# Patient Record
Sex: Female | Born: 1985 | Race: White | Hispanic: No | Marital: Single | Smoking: Never smoker
Health system: Southern US, Community
[De-identification: ages and names within clinical notes are randomized; demographics above are authoritative.]

## PROBLEM LIST (undated history)

## (undated) DIAGNOSIS — F319 Bipolar disorder, unspecified: Secondary | ICD-10-CM

## (undated) DIAGNOSIS — S069X9A Unspecified intracranial injury with loss of consciousness of unspecified duration, initial encounter: Secondary | ICD-10-CM

## (undated) DIAGNOSIS — K219 Gastro-esophageal reflux disease without esophagitis: Secondary | ICD-10-CM

## (undated) DIAGNOSIS — F4310 Post-traumatic stress disorder, unspecified: Secondary | ICD-10-CM

## (undated) DIAGNOSIS — S069XAA Unspecified intracranial injury with loss of consciousness status unknown, initial encounter: Secondary | ICD-10-CM

## (undated) DIAGNOSIS — R748 Abnormal levels of other serum enzymes: Secondary | ICD-10-CM

## (undated) DIAGNOSIS — I10 Essential (primary) hypertension: Secondary | ICD-10-CM

## (undated) DIAGNOSIS — G629 Polyneuropathy, unspecified: Secondary | ICD-10-CM

## (undated) DIAGNOSIS — R000 Tachycardia, unspecified: Secondary | ICD-10-CM

## (undated) DIAGNOSIS — D509 Iron deficiency anemia, unspecified: Secondary | ICD-10-CM

## (undated) DIAGNOSIS — G5700 Lesion of sciatic nerve, unspecified lower limb: Secondary | ICD-10-CM

## (undated) DIAGNOSIS — G43909 Migraine, unspecified, not intractable, without status migrainosus: Secondary | ICD-10-CM

## (undated) DIAGNOSIS — G4730 Sleep apnea, unspecified: Secondary | ICD-10-CM

## (undated) DIAGNOSIS — F32A Depression, unspecified: Secondary | ICD-10-CM

## (undated) DIAGNOSIS — U071 COVID-19: Secondary | ICD-10-CM

## (undated) DIAGNOSIS — F419 Anxiety disorder, unspecified: Secondary | ICD-10-CM

## (undated) DIAGNOSIS — R569 Unspecified convulsions: Secondary | ICD-10-CM

## (undated) HISTORY — PX: APPENDECTOMY: SHX54

## (undated) HISTORY — PX: OTHER SURGICAL HISTORY: SHX169

## (undated) HISTORY — PX: ABDOMINAL HYSTERECTOMY: SHX81

## (undated) HISTORY — PX: CHOLECYSTECTOMY: SHX55

## (undated) HISTORY — PX: TONSILLECTOMY: SUR1361

## (undated) HISTORY — PX: NOSE SURGERY: SHX723

## (undated) HISTORY — PX: TUBAL LIGATION: SHX77

---
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
## 2007-07-03 ENCOUNTER — Other Ambulatory Visit: Payer: Self-pay

## 2007-07-04 ENCOUNTER — Inpatient Hospital Stay: Payer: Self-pay | Admitting: Internal Medicine

## 2007-12-07 ENCOUNTER — Emergency Department (HOSPITAL_COMMUNITY): Admission: EM | Admit: 2007-12-07 | Discharge: 2007-12-08 | Payer: Self-pay | Admitting: Emergency Medicine
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
## 2011-08-16 ENCOUNTER — Other Ambulatory Visit (HOSPITAL_BASED_OUTPATIENT_CLINIC_OR_DEPARTMENT_OTHER): Payer: Self-pay | Admitting: Internal Medicine

## 2011-08-16 DIAGNOSIS — E221 Hyperprolactinemia: Secondary | ICD-10-CM

## 2011-08-16 DIAGNOSIS — R51 Headache: Secondary | ICD-10-CM

## 2011-08-22 ENCOUNTER — Ambulatory Visit (HOSPITAL_BASED_OUTPATIENT_CLINIC_OR_DEPARTMENT_OTHER)
Admission: RE | Admit: 2011-08-22 | Discharge: 2011-08-22 | Disposition: A | Discharge status: Home / Self Care | Payer: Medicaid Other | Source: Ambulatory Visit | Attending: Internal Medicine | Admitting: Internal Medicine

## 2011-08-22 DIAGNOSIS — R51 Headache: Secondary | ICD-10-CM | POA: Insufficient documentation

## 2011-08-22 DIAGNOSIS — E221 Hyperprolactinemia: Secondary | ICD-10-CM

## 2011-08-22 DIAGNOSIS — R569 Unspecified convulsions: Secondary | ICD-10-CM | POA: Insufficient documentation

## 2011-08-22 DIAGNOSIS — E229 Hyperfunction of pituitary gland, unspecified: Secondary | ICD-10-CM | POA: Insufficient documentation

## 2011-08-22 MED ORDER — GADOBENATE DIMEGLUMINE 529 MG/ML IV SOLN
9.0000 mL | Freq: Once | INTRAVENOUS | Status: AC | PRN
  Administered 2011-08-22: 9 mL via INTRAVENOUS

## 2014-10-19 ENCOUNTER — Emergency Department: Payer: Medicaid Other

## 2014-10-19 ENCOUNTER — Emergency Department
Admission: EM | Admit: 2014-10-19 | Discharge: 2014-10-19 | Disposition: A | Discharge status: Home / Self Care | Payer: Medicaid Other | Attending: Emergency Medicine | Admitting: Emergency Medicine

## 2014-10-19 ENCOUNTER — Encounter: Payer: Self-pay | Admitting: Emergency Medicine

## 2014-10-19 DIAGNOSIS — Y998 Other external cause status: Secondary | ICD-10-CM | POA: Diagnosis not present

## 2014-10-19 DIAGNOSIS — X58XXXA Exposure to other specified factors, initial encounter: Secondary | ICD-10-CM | POA: Diagnosis not present

## 2014-10-19 DIAGNOSIS — Y9301 Activity, walking, marching and hiking: Secondary | ICD-10-CM | POA: Diagnosis not present

## 2014-10-19 DIAGNOSIS — Z79899 Other long term (current) drug therapy: Secondary | ICD-10-CM | POA: Diagnosis not present

## 2014-10-19 DIAGNOSIS — S93431A Sprain of tibiofibular ligament of right ankle, initial encounter: Secondary | ICD-10-CM | POA: Diagnosis not present

## 2014-10-19 DIAGNOSIS — S93401A Sprain of unspecified ligament of right ankle, initial encounter: Secondary | ICD-10-CM

## 2014-10-19 DIAGNOSIS — Y9289 Other specified places as the place of occurrence of the external cause: Secondary | ICD-10-CM | POA: Diagnosis not present

## 2014-10-19 DIAGNOSIS — S99911A Unspecified injury of right ankle, initial encounter: Secondary | ICD-10-CM | POA: Diagnosis present

## 2014-10-19 MED ORDER — NAPROXEN 500 MG PO TABS
500.0000 mg | ORAL_TABLET | Freq: Once | ORAL | Status: AC
  Administered 2014-10-19: 500 mg via ORAL
  Filled 2014-10-19: qty 1

## 2014-10-19 MED ORDER — TRAMADOL HCL 50 MG PO TABS
50.0000 mg | ORAL_TABLET | Freq: Once | ORAL | Status: AC
  Administered 2014-10-19: 50 mg via ORAL
  Filled 2014-10-19: qty 1

## 2014-10-19 MED ORDER — TRAMADOL HCL 50 MG PO TABS: 50.0000 mg | ORAL_TABLET | Freq: Four times a day (QID) | ORAL | Status: DC | PRN

## 2014-10-19 MED ORDER — NAPROXEN 500 MG PO TABS: 500.0000 mg | ORAL_TABLET | Freq: Two times a day (BID) | ORAL | Status: AC

## 2014-10-19 NOTE — Discharge Instructions (Signed)

## 2014-10-19 NOTE — ED Provider Notes (Signed)
[facility] Emergency Department Provider Note ____________________________________________  Time seen: Approximately 10:36 PM  I have reviewed the triage vital signs and the nursing notes.   HISTORY  Chief Complaint Ankle Pain   HPI Sheila Cooper is a 29 y.o. female who presents to the emergency department for evaluation of right ankle pain. She states that the pain started after walking on a wooded trail yesterday. No specific injury.   History reviewed. No pertinent past medical history.  There are no active problems to display for this patient.   Past Surgical History  Procedure Laterality Date  . Tubal ligation    . Nose surgery      Current Outpatient Rx  Name  Route  Sig  Dispense  Refill  . ARIPiprazole (ABILIFY) 2 MG tablet   Oral   Take 1 mg by mouth daily.         . clonazePAM (KLONOPIN) 0.5 MG tablet   Oral   Take 0.5 mg by mouth 2 (two) times daily as needed for anxiety.         Marland Kitchen QUEtiapine (SEROQUEL) 50 MG tablet   Oral   Take 50 mg by mouth at bedtime.         . topiramate (TOPAMAX) 100 MG tablet   Oral   Take 100 mg by mouth 2 (two) times daily.         . naproxen (NAPROSYN) 500 MG tablet   Oral   Take 1 tablet (500 mg total) by mouth 2 (two) times daily with a meal.   60 tablet   2   . traMADol (ULTRAM) 50 MG tablet   Oral   Take 1 tablet (50 mg total) by mouth every 6 (six) hours as needed.   9 tablet   0     Allergies Review of patient's allergies indicates no known allergies.  No family history on file.  Social History Social History  Substance Use Topics  . Smoking status: Never Smoker   . Smokeless tobacco: None  . Alcohol Use: No    Review of Systems Constitutional: No recent illness. Eyes: No visual changes. ENT: No sore throat. Cardiovascular: Denies chest pain or palpitations. Respiratory: Denies shortness of breath. Gastrointestinal: No abdominal pain.  Genitourinary: Negative  for dysuria. Musculoskeletal: Pain in right ankle. Skin: Negative for rash. Neurological: Negative for headaches, focal weakness or numbness. 10-point ROS otherwise negative.  ____________________________________________   PHYSICAL EXAM:  VITAL SIGNS: ED Triage Vitals  Enc Vitals Group     BP 10/19/14 2124 138/89 mmHg     Pulse Rate 10/19/14 2124 109     Resp 10/19/14 2124 18     Temp 10/19/14 2124 97.6 F (36.4 C)     Temp Source 10/19/14 2124 Oral     SpO2 10/19/14 2124 99 %     Weight 10/19/14 2124 182 lb (82.555 kg)     Height 10/19/14 2124  (1.651 m)     Head Cir --      Peak Flow --      Pain Score 10/19/14 2125 10     Pain Loc --      Pain Edu? --      Excl. in GC? --     Constitutional: Alert and oriented. Well appearing and in no acute distress. Eyes: Conjunctivae are normal. EOMI. Head: Atraumatic. Nose: No congestion/rhinnorhea. Neck: No stridor.  Respiratory: Normal respiratory effort.   Musculoskeletal: No edema or erythema noted. Limited ROM  due to pain. Neurologic:  Normal speech and language. No gross focal neurologic deficits are appreciated. Speech is normal. No gait instability. Skin:  Skin is warm, dry and intact. Atraumatic. Psychiatric: Mood and affect are normal. Speech and behavior are normal.  ____________________________________________   LABS (all labs ordered are listed, but only abnormal results are displayed)  Labs Reviewed - No data to display ____________________________________________  RADIOLOGY  Negative for acute abnormality.  I, Kem Boroughs, personally viewed and evaluated these images (plain radiographs) as part of my medical decision making.   ____________________________________________   PROCEDURES  Procedure(s) performed: SPLINT APPLICATION Date/Time: 10:39 PM Authorized by: Kem Boroughs Consent: Verbal consent obtained. Risks and benefits: risks, benefits and alternatives were discussed Consent given  by: patient Splint applied by: Marylu Lund, ER technician Location details: right ankle Splint type: stirrup Supplies used: velcro splint. Post-procedure: The splinted body part was neurovascularly unchanged following the procedure. Patient tolerance: Patient tolerated the procedure well with no immediate complications.      ____________________________________________   INITIAL IMPRESSION / ASSESSMENT AND PLAN / ED COURSE  Pertinent labs & imaging results that were available during my care of the patient were reviewed by me and considered in my medical decision making (see chart for details).  Patient was advised to follow up with orthopedics for symptoms that are not improving over the next 7 days. She was  also advised to return to the ER for symptoms that change or worsen if unable to schedule an appointment.     Chinita Pester, FNP 10/19/14 2241  Loleta Rose, MD 10/20/14 0001

## 2014-10-19 NOTE — ED Notes (Signed)
Pt to triage via w/c with no distress noted; pt reports right ankle after injuring while walking yesterday

## 2014-12-28 ENCOUNTER — Encounter: Payer: Self-pay | Admitting: Emergency Medicine

## 2014-12-28 ENCOUNTER — Emergency Department
Admission: EM | Admit: 2014-12-28 | Discharge: 2014-12-28 | Disposition: A | Discharge status: Home / Self Care | Payer: Medicaid Other | Attending: Emergency Medicine | Admitting: Emergency Medicine

## 2014-12-28 DIAGNOSIS — Z791 Long term (current) use of non-steroidal anti-inflammatories (NSAID): Secondary | ICD-10-CM | POA: Insufficient documentation

## 2014-12-28 DIAGNOSIS — Z79899 Other long term (current) drug therapy: Secondary | ICD-10-CM | POA: Insufficient documentation

## 2014-12-28 DIAGNOSIS — R05 Cough: Secondary | ICD-10-CM | POA: Diagnosis present

## 2014-12-28 DIAGNOSIS — J029 Acute pharyngitis, unspecified: Secondary | ICD-10-CM | POA: Insufficient documentation

## 2014-12-28 HISTORY — DX: Unspecified convulsions: R56.9

## 2014-12-28 HISTORY — DX: Anxiety disorder, unspecified: F41.9

## 2014-12-28 HISTORY — DX: Bipolar disorder, unspecified: F31.9

## 2014-12-28 LAB — POCT RAPID STREP A: STREPTOCOCCUS, GROUP A SCREEN (DIRECT): NEGATIVE

## 2014-12-28 MED ORDER — MAGIC MOUTHWASH W/LIDOCAINE: 5.0000 mL | Freq: Four times a day (QID) | ORAL | Status: DC

## 2014-12-28 MED ORDER — BENZONATATE 200 MG PO CAPS: 200.0000 mg | ORAL_CAPSULE | Freq: Three times a day (TID) | ORAL | Status: DC | PRN

## 2014-12-28 NOTE — ED Notes (Signed)
Pt to ed with c/o sore throat, cough since Saturday.

## 2014-12-28 NOTE — ED Provider Notes (Signed)
[facility] Emergency Department Provider Note  ____________________________________________  Time seen: Approximately 1:24 PM  I have reviewed the triage vital signs and the nursing notes.   HISTORY  Chief Complaint Cough and Sore Throat    HPI Sheila Cooper is a 29 y.o. female presents to the emergency department complaining of a sore throat for 2 days. She states initially she had some general malaise on Saturday and progressed into a "slightly sore throat." Over the intervening. The sore throat has greatly increased. She states she has mild nasal congestion, a cough, possible low-grade tactile fever. She denies taking any medications over-the-counter prior to arrival. No difficulty swallowing or breathing. Symptoms are constant, worsening, worse with talking and swallowing.   Past Medical History  Diagnosis Date  . Seizures (HCC)   . Bipolar 1 disorder (HCC)   . Anxiety     There are no active problems to display for this patient.   Past Surgical History  Procedure Laterality Date  . Tubal ligation    . Nose surgery      Current Outpatient Rx  Name  Route  Sig  Dispense  Refill  . ARIPiprazole (ABILIFY) 2 MG tablet   Oral   Take 1 mg by mouth daily.         . benzonatate (TESSALON) 200 MG capsule   Oral   Take 1 capsule (200 mg total) by mouth 3 (three) times daily as needed for cough.   21 capsule   0   . clonazePAM (KLONOPIN) 0.5 MG tablet   Oral   Take 0.5 mg by mouth 2 (two) times daily as needed for anxiety.         . magic mouthwash w/lidocaine SOLN   Oral   Take 5 mLs by mouth 4 (four) times daily.   240 mL   0     Dispense in a 1/1/1/1 ratio   . naproxen (NAPROSYN) 500 MG tablet   Oral   Take 1 tablet (500 mg total) by mouth 2 (two) times daily with a meal.   60 tablet   2   . QUEtiapine (SEROQUEL) 50 MG tablet   Oral   Take 50 mg by mouth at bedtime.         . topiramate (TOPAMAX) 100 MG tablet   Oral  Take 100 mg by mouth 2 (two) times daily.         . traMADol (ULTRAM) 50 MG tablet   Oral   Take 1 tablet (50 mg total) by mouth every 6 (six) hours as needed.   9 tablet   0     Allergies Review of patient's allergies indicates no known allergies.  History reviewed. No pertinent family history.  Social History Social History  Substance Use Topics  . Smoking status: Never Smoker   . Smokeless tobacco: None  . Alcohol Use: No    Review of Systems Constitutional: No fever/chills Eyes: No visual changes. ENT: Endorses sore throat. Endorses mild nasal congestion. Cardiovascular: Denies chest pain. Respiratory: Denies shortness of breath. Dorsum his cough. Gastrointestinal: No abdominal pain.  No nausea, no vomiting.  No diarrhea.  No constipation. Genitourinary: Negative for dysuria. Musculoskeletal: Negative for back pain. Skin: Negative for rash. Neurological: Negative for headaches, focal weakness or numbness.  10-point ROS otherwise negative.  ____________________________________________   PHYSICAL EXAM:  VITAL SIGNS: ED Triage Vitals  Enc Vitals Group     BP 12/28/14 1314 162/102 mmHg     Pulse  Rate 12/28/14 1314 109     Resp 12/28/14 1314 22     Temp 12/28/14 1314 98.1 F (36.7 C)     Temp Source 12/28/14 1314 Oral     SpO2 12/28/14 1314 99 %     Weight 12/28/14 1314 162 lb (73.483 kg)     Height 12/28/14 1314 5\' 5"  (1.651 m)     Head Cir --      Peak Flow --      Pain Score 12/28/14 1314 9     Pain Loc --      Pain Edu? --      Excl. in GC? --     Constitutional: Alert and oriented. Well appearing and in no acute distress. Eyes: Conjunctivae are normal. PERRL. EOMI. Head: Atraumatic. Nose: Minimal clear congestion/rhinnorhea. Mouth/Throat: Mucous membranes are moist.  Oropharynx erythematous. Tonsils are erythematous and mildly edematous, but no exudates are visualized. Neck: No stridor.   Hematological/Lymphatic/Immunilogical: Diffuse,  nontender, mobile anterior cervical lymphadenopathy. Cardiovascular: Normal rate, regular rhythm. Grossly normal heart sounds.  Good peripheral circulation. Respiratory: Normal respiratory effort.  No retractions. Lungs CTAB. Gastrointestinal: Soft and nontender. No distention. No abdominal bruits. No CVA tenderness. Musculoskeletal: No lower extremity tenderness nor edema.  No joint effusions. Neurologic:  Normal speech and language. No gross focal neurologic deficits are appreciated. No gait instability. Skin:  Skin is warm, dry and intact. No rash noted. Psychiatric: Mood and affect are normal. Speech and behavior are normal.  ____________________________________________   LABS (all labs ordered are listed, but only abnormal results are displayed)  Labs Reviewed  CULTURE, GROUP A STREP ([facility] ONLY)  POCT RAPID STREP A   ____________________________________________  EKG   ____________________________________________  RADIOLOGY   ____________________________________________   PROCEDURES  Procedure(s) performed: None  Critical Care performed: No  ____________________________________________   INITIAL IMPRESSION / ASSESSMENT AND PLAN / ED COURSE  Pertinent labs & imaging results that were available during my care of the patient were reviewed by me and considered in my medical decision making (see chart for details).  Patient's history, symptoms, physical exam, rapid strep test are taken into consideration for diagnosis. Patient's symptoms are most consistent with a viral pharyngitis. I advised patient of findings and diagnosis she verbalizes understanding. The patient will be given medications for symptomatic relief. Patient is to return to emergency department for any worsening of symptoms to include difficulty swallowing or difficulty breathing. ____________________________________________   FINAL CLINICAL IMPRESSION(S) / ED DIAGNOSES  Final diagnoses:  Pharyngitis       Racheal PatchesJonathan D Starr Engel, PA-C 12/28/14 1441  Governor Rooksebecca Lord, MD 12/28/14 [phone]

## 2014-12-28 NOTE — ED Notes (Signed)
Cough and sore throat for couple of days

## 2014-12-28 NOTE — Discharge Instructions (Signed)

## 2014-12-30 LAB — CULTURE, GROUP A STREP (THRC)

## 2015-01-05 ENCOUNTER — Encounter: Payer: Self-pay | Admitting: Urgent Care

## 2015-01-05 DIAGNOSIS — R51 Headache: Secondary | ICD-10-CM | POA: Diagnosis present

## 2015-01-05 DIAGNOSIS — G43901 Migraine, unspecified, not intractable, with status migrainosus: Secondary | ICD-10-CM | POA: Diagnosis not present

## 2015-01-05 NOTE — ED Notes (Signed)
Patient presents with c/o of a migraine headache that started at 1900. Denies N/V.

## 2015-01-06 ENCOUNTER — Emergency Department
Admission: EM | Admit: 2015-01-06 | Discharge: 2015-01-06 | Disposition: A | Discharge status: Home / Self Care | Payer: Medicaid Other | Attending: Emergency Medicine | Admitting: Emergency Medicine

## 2015-01-06 DIAGNOSIS — G43109 Migraine with aura, not intractable, without status migrainosus: Secondary | ICD-10-CM

## 2015-01-06 HISTORY — DX: Migraine, unspecified, not intractable, without status migrainosus: G43.909

## 2015-01-06 MED ORDER — METOCLOPRAMIDE HCL 10 MG PO TABS: 10.0000 mg | ORAL_TABLET | Freq: Three times a day (TID) | ORAL | Status: DC | PRN

## 2015-01-06 MED ORDER — SODIUM CHLORIDE 0.9 % IV BOLUS (SEPSIS)
1000.0000 mL | Freq: Once | INTRAVENOUS | Status: AC
  Administered 2015-01-06: 1000 mL via INTRAVENOUS

## 2015-01-06 MED ORDER — METOCLOPRAMIDE HCL 5 MG/ML IJ SOLN
10.0000 mg | Freq: Once | INTRAMUSCULAR | Status: AC
  Administered 2015-01-06: 10 mg via INTRAVENOUS
  Filled 2015-01-06: qty 2

## 2015-01-06 MED ORDER — KETOROLAC TROMETHAMINE 30 MG/ML IJ SOLN
30.0000 mg | Freq: Once | INTRAMUSCULAR | Status: AC
  Administered 2015-01-06: 30 mg via INTRAVENOUS
  Filled 2015-01-06: qty 1

## 2015-01-06 NOTE — Discharge Instructions (Signed)

## 2015-01-06 NOTE — ED Provider Notes (Signed)
[facility] Emergency Department Provider Note REMINDER - THIS NOTE IS NOT A FINAL MEDICAL RECORD UNTIL IT IS SIGNED. UNTIL THEN, THE CONTENT BELOW MAY REFLECT INFORMATION FROM A DOCUMENTATION TEMPLATE, NOT THE ACTUAL PATIENT VISIT. ____________________________________________  Time seen: Approximately 1:53 AM  I have reviewed the triage vital signs and the nursing notes.   HISTORY  Chief Complaint Migraine    HPI Sheila Cooper is a 29 y.o. female reports a previous history of migraines,bipolar disorder.  Patient tells me that she has frequent migraines almost as frequent as weekly, or sometimes even every few days.  She reports to me that she had a "migraine" started about 7 p today. She notes slight flickering lights in her eyes, and then the headache proceeded thereafter. She describes a primarily frontal and right sided throbbing headache. No vision changes at this time. No chest pain or trouble breathing. Denies fevers chills sore throat. No numbness or weakness. She describes a severe 10 out of 10, throbbing right-sided headache.  She has had a headache like this many times in the past, this is not the worst headache she has experienced, slowly worsened over the last couple hours and is associated with nausea.  She denies pregnancy, she has had a previous tubal ligation. Last menstrual period ended yesterday.   Past Medical History  Diagnosis Date  . Seizures (HCC)   . Bipolar 1 disorder (HCC)   . Anxiety   . Migraines     There are no active problems to display for this patient.   Past Surgical History  Procedure Laterality Date  . Tubal ligation    . Nose surgery      Current Outpatient Rx  Name  Route  Sig  Dispense  Refill  . ARIPiprazole (ABILIFY) 2 MG tablet   Oral   Take 1 mg by mouth daily.         . benzonatate (TESSALON) 200 MG capsule   Oral   Take 1 capsule (200 mg total) by mouth 3 (three) times daily as needed for  cough.   21 capsule   0   . clonazePAM (KLONOPIN) 0.5 MG tablet   Oral   Take 0.5 mg by mouth 2 (two) times daily as needed for anxiety.         . magic mouthwash w/lidocaine SOLN   Oral   Take 5 mLs by mouth 4 (four) times daily.   240 mL   0     Dispense in a 1/1/1/1 ratio   . metoCLOPramide (REGLAN) 10 MG tablet   Oral   Take 1 tablet (10 mg total) by mouth every 8 (eight) hours as needed for nausea or vomiting.   30 tablet   0   . naproxen (NAPROSYN) 500 MG tablet   Oral   Take 1 tablet (500 mg total) by mouth 2 (two) times daily with a meal.   60 tablet   2   . QUEtiapine (SEROQUEL) 50 MG tablet   Oral   Take 50 mg by mouth at bedtime.         . topiramate (TOPAMAX) 100 MG tablet   Oral   Take 100 mg by mouth 2 (two) times daily.         . traMADol (ULTRAM) 50 MG tablet   Oral   Take 1 tablet (50 mg total) by mouth every 6 (six) hours as needed.   9 tablet   0     Allergies Review  of patient's allergies indicates no known allergies.  No family history on file.  Social History Social History  Substance Use Topics  . Smoking status: Never Smoker   . Smokeless tobacco: None  . Alcohol Use: No    Review of Systems Constitutional: No fever/chills Eyes: No visual changes. ENT: No sore throat. Cardiovascular: Denies chest pain. Respiratory: Denies shortness of breath. Gastrointestinal: No abdominal pain.  No nausea, no vomiting.  No diarrhea.  No constipation. Genitourinary: Negative for dysuria. Musculoskeletal: Negative for back pain. Skin: Negative for rash. Neurological: Negative for focal weakness or numbness.  10-point ROS otherwise negative.  ____________________________________________   PHYSICAL EXAM:  VITAL SIGNS: ED Triage Vitals  Enc Vitals Group     BP 01/05/15 2352 145/95 mmHg     Pulse Rate 01/05/15 2352 85     Resp 01/05/15 2352 18     Temp 01/05/15 2352 98.4 F (36.9 C)     Temp Source 01/05/15 2352 Oral      SpO2 01/05/15 2352 98 %     Weight 01/05/15 2352 160 lb (72.576 kg)     Height 01/05/15 2352 5\' 5"  (1.651 m)     Head Cir --      Peak Flow --      Pain Score 01/05/15 2352 10     Pain Loc --      Pain Edu? --      Excl. in GC? --    Constitutional: Alert and oriented. Well appearing and in no acute distress. Eyes: Conjunctivae are normal. PERRL. EOMI. Head: Atraumatic. No temporal artery tenderness. Normal temporal artery pulsations bilaterally. Nose: No congestion/rhinnorhea. Mouth/Throat: Mucous membranes are moist.  Oropharynx non-erythematous. Neck: No stridor.  No meningismus. Cardiovascular: Normal rate, regular rhythm. Grossly normal heart sounds.  Good peripheral circulation. Respiratory: Normal respiratory effort.  No retractions. Lungs CTAB. Gastrointestinal: Soft and nontender. No distention. No abdominal bruits. No CVA tenderness. Musculoskeletal: No lower extremity tenderness nor edema.  No joint effusions. Neurologic:  Normal speech and language. No gross focal neurologic deficits are appreciated.   The patient has no pronator drift. The patient has normal cranial nerve exam. Extraocular movements are normal. Visual fields are normal. Patient has 5 out of 5 strength in all extremities. There is no numbness or gross, acute sensory abnormality in the extremities bilaterally. No speech disturbance. No dysarthria. No aphasia. No ataxia. Normal finger nose finger bilat. Patient speaking in full and clear sentences.   Skin:  Skin is warm, dry and intact. No rash noted. Psychiatric: Mood and affect are normal. Speech and behavior are normal.  ____________________________________________   LABS (all labs ordered are listed, but only abnormal results are displayed)  Labs Reviewed - No data to display ____________________________________________  EKG   ____________________________________________  RADIOLOGY  Patient reports [facility] ____________________________________________   PROCEDURES  Procedure(s) performed: None  Critical Care performed: No  ____________________________________________   INITIAL IMPRESSION / ASSESSMENT AND PLAN / ED COURSE  Pertinent labs & imaging results that were available during my care of the patient were reviewed by me and considered in my medical decision making (see chart for details).  Patient presents for headache. She has a history of previous similar headaches, and reports a well-documented history of "migraines". She does appear to describe scintilating cotomata preceding this headache, and she does not have any neurologic deficits or fever. No signs or symptoms of intracranial hemorrhage, meningitis, or other concerning acute causes for  headache at this time.  I will treat her with Reglan and Toradol. We will plan to reevaluate her after, given her long history of similar headaches diagnosed as migraines I suspect that this is the same, no evidence to support other concerning presentations.  ----------------------------------------- 1:56 AM on 01/06/2015 -----------------------------------------  Discuss careful return precautions and follow-up care with the patient who is agreeable.  Patient reports her pain is gone, she feels well and improved. Boyfriend will be driving her home. Careful return precautions discussed. Patient is awake and alert and in no distress. ____________________________________________   FINAL CLINICAL IMPRESSION(S) / ED DIAGNOSES  Final diagnoses:  Migraine with aura and without status migrainosus, not intractable      Sharyn Creamer, MD 01/06/15 [phone]

## 2016-07-18 ENCOUNTER — Other Ambulatory Visit: Payer: Self-pay | Admitting: Neurology

## 2016-07-18 ENCOUNTER — Emergency Department (HOSPITAL_COMMUNITY): Payer: Medicaid Other

## 2016-07-18 ENCOUNTER — Emergency Department (HOSPITAL_COMMUNITY)
Admission: EM | Admit: 2016-07-18 | Discharge: 2016-07-19 | Disposition: A | Discharge status: Home / Self Care | Payer: Medicaid Other | Attending: Emergency Medicine | Admitting: Emergency Medicine

## 2016-07-18 DIAGNOSIS — R4182 Altered mental status, unspecified: Secondary | ICD-10-CM | POA: Diagnosis present

## 2016-07-18 DIAGNOSIS — Y939 Activity, unspecified: Secondary | ICD-10-CM | POA: Diagnosis not present

## 2016-07-18 DIAGNOSIS — F445 Conversion disorder with seizures or convulsions: Secondary | ICD-10-CM | POA: Insufficient documentation

## 2016-07-18 DIAGNOSIS — Z79899 Other long term (current) drug therapy: Secondary | ICD-10-CM | POA: Diagnosis not present

## 2016-07-18 DIAGNOSIS — R569 Unspecified convulsions: Secondary | ICD-10-CM

## 2016-07-18 DIAGNOSIS — R791 Abnormal coagulation profile: Secondary | ICD-10-CM | POA: Insufficient documentation

## 2016-07-18 DIAGNOSIS — Y929 Unspecified place or not applicable: Secondary | ICD-10-CM | POA: Insufficient documentation

## 2016-07-18 DIAGNOSIS — W19XXXA Unspecified fall, initial encounter: Secondary | ICD-10-CM | POA: Diagnosis not present

## 2016-07-18 DIAGNOSIS — Y999 Unspecified external cause status: Secondary | ICD-10-CM | POA: Insufficient documentation

## 2016-07-18 DIAGNOSIS — R4189 Other symptoms and signs involving cognitive functions and awareness: Secondary | ICD-10-CM

## 2016-07-18 LAB — URINALYSIS, ROUTINE W REFLEX MICROSCOPIC
Bilirubin Urine: NEGATIVE
Glucose, UA: NEGATIVE mg/dL
Ketones, ur: NEGATIVE mg/dL
Nitrite: NEGATIVE
Protein, ur: NEGATIVE mg/dL
Specific Gravity, Urine: 1.012 (ref 1.005–1.030)
pH: 7 (ref 5.0–8.0)

## 2016-07-18 LAB — COMPREHENSIVE METABOLIC PANEL
ALT: 19 U/L (ref 14–54)
AST: 22 U/L (ref 15–41)
Albumin: 4.1 g/dL (ref 3.5–5.0)
Alkaline Phosphatase: 94 U/L (ref 38–126)
Anion gap: 9 (ref 5–15)
BUN: 10 mg/dL (ref 6–20)
CO2: 23 mmol/L (ref 22–32)
Calcium: 8.8 mg/dL — ABNORMAL LOW (ref 8.9–10.3)
Chloride: 106 mmol/L (ref 101–111)
Creatinine, Ser: 0.91 mg/dL (ref 0.44–1.00)
GFR calc Af Amer: >60 mL/min (ref >60)
GFR calc non Af Amer: >60 mL/min (ref >60)
Glucose, Bld: 92 mg/dL (ref 65–99)
Potassium: 3.6 mmol/L (ref 3.5–5.1)
Sodium: 138 mmol/L (ref 135–145)
Total Bilirubin: 0.5 mg/dL (ref 0.3–1.2)
Total Protein: 7.6 g/dL (ref 6.5–8.1)

## 2016-07-18 LAB — CBC
HCT: 42.2 % (ref 36.0–46.0)
Hemoglobin: 13.8 g/dL (ref 12.0–15.0)
MCH: 27.1 pg (ref 26.0–34.0)
MCHC: 32.7 g/dL (ref 30.0–36.0)
MCV: 82.9 fL (ref 78.0–100.0)
Platelets: 242 10*3/uL (ref 150–400)
RBC: 5.09 MIL/uL (ref 3.87–5.11)
RDW: 13.1 % (ref 11.5–15.5)
WBC: 12.1 10*3/uL — ABNORMAL HIGH (ref 4.0–10.5)

## 2016-07-18 LAB — PREPARE FRESH FROZEN PLASMA
Unit division: 0
Unit division: 0

## 2016-07-18 LAB — I-STAT CHEM 8, ED
BUN: 12 mg/dL (ref 6–20)
Calcium, Ion: 1.03 mmol/L — ABNORMAL LOW (ref 1.15–1.40)
Chloride: 107 mmol/L (ref 101–111)
Creatinine, Ser: 0.9 mg/dL (ref 0.44–1.00)
Glucose, Bld: 90 mg/dL (ref 65–99)
HCT: 41 % (ref 36.0–46.0)
Hemoglobin: 13.9 g/dL (ref 12.0–15.0)
Potassium: 3.6 mmol/L (ref 3.5–5.1)
Sodium: 141 mmol/L (ref 135–145)
TCO2: 28 mmol/L (ref 0–100)

## 2016-07-18 LAB — LACTIC ACID, PLASMA: Lactic Acid, Venous: 1.7 mmol/L (ref 0.5–1.9)

## 2016-07-18 LAB — BPAM FFP
Blood Product Expiration Date: 201806092359
Blood Product Expiration Date: 201806092359
ISSUE DATE / TIME: 201806051923
ISSUE DATE / TIME: 201806051923
Unit Type and Rh: 6200
Unit Type and Rh: 6200

## 2016-07-18 LAB — I-STAT CG4 LACTIC ACID, ED: Lactic Acid, Venous: 1.25 mmol/L (ref 0.5–1.9)

## 2016-07-18 LAB — PROTIME-INR
INR: 1.03
Prothrombin Time: 13.6 seconds (ref 11.4–15.2)

## 2016-07-18 LAB — ETHANOL: Alcohol, Ethyl (B): <5 mg/dL (ref <5)

## 2016-07-18 LAB — ABO/RH: ABO/RH(D): O NEG

## 2016-07-18 MED ORDER — SODIUM CHLORIDE 0.9 % IV BOLUS (SEPSIS)
1000.0000 mL | Freq: Once | INTRAVENOUS | Status: AC
  Administered 2016-07-18: 1000 mL via INTRAVENOUS

## 2016-07-18 MED ORDER — KETOROLAC TROMETHAMINE 15 MG/ML IJ SOLN
15.0000 mg | Freq: Once | INTRAMUSCULAR | Status: AC
  Administered 2016-07-18: 15 mg via INTRAVENOUS
  Filled 2016-07-18: qty 1

## 2016-07-18 NOTE — Progress Notes (Signed)
   07/18/16 1900  Clinical Encounter Type  Visited With Patient;Family;Health care provider  Visit Type Trauma  Referral From Care management   Chaplain responded first to a level one but it was downgraded to a level 2 within 20mins. [age] yr. female with an unwitnessed fall. After MD evaluated Pt, Chaplain asked Pt if she wanted staff to reach out to a family member, notifying them that she was at [facility]. Chaplain did not get a response, however a loved one did show up and chaplain (with approval from the Pt's RN) brought boyfriend/husband back to the trauma room to see the Pt.  Chaplain also provided hospitality by providing the loved one with something to drink as both Pt and family member waited for an update.

## 2016-07-18 NOTE — ED Provider Notes (Signed)
[facility]-EMERGENCY DEPT Provider Note   CSN: [id number] Arrival date & time: 07/18/16  1934   By signing my name below, I, Freida Busman, attest that this documentation has been prepared under the direction and in the presence of Raeford Razor, MD . Electronically Signed: Freida Busman, Scribe. 07/18/2016. 8:00 PM.   History   Chief Complaint Chief Complaint  Patient presents with  . Fall  . Altered Mental Status   LEVEL 5 CAVEAT DUE TO ACUITY OF MEDICAL CONDTION  The history is provided by the EMS personnel. No language interpreter was used.     HPI Comments:  Sheila Cooper is a 31 y.o. female who presents to the Emergency Department via EMS for AMS s/p fall today. Pt fell from an unknown height at an unknown time today. She noted to friend on scene that she struck her head on the concrete. EMS states pt did not remember the fall or anyting that has happend today. She complained of head and neck pain to EMS.  EMS notes decresed consciousuness en route but states pt was verbal upon their arrival. Pt reponds to painful stimuli per EMS. She has a h/o seizures and psych history. She was placed on new psych meds 1 week ago. EMS placed a 22 gauge IV in the left forearm. EMS notes 150/110 BP upon arrival to pt; last BP was 120/70. She has been tachycardic en route with a  CBG of 121 and O2 saturation of 96% on RA.  No past medical history on file.  There are no active problems to display for this patient.   No past surgical history on file.  OB History    No data available       Home Medications    Prior to Admission medications   Not on File    Family History No family history on file.  Social History Social History  Substance Use Topics  . Smoking status: Not on file  . Smokeless tobacco: Not on file  . Alcohol use Not on file     Allergies   Patient has no allergy information on record.   Review of Systems Review of Systems  Unable to perform ROS: Acuity of  condition     Physical Exam Updated Vital Signs BP (!) 143/93   Pulse (!) 113   Temp 97.8 F (36.6 C) (Temporal)   Resp 14   Ht 5\' 4"  (1.626 m)   Wt 220 lb (99.8 kg)   SpO2 99%   BMI 37.76 kg/m   Physical Exam  Constitutional: She appears well-developed and well-nourished.  HENT:  Head: Normocephalic and atraumatic.  No signs of trauma to the head or neck   Eyes: Conjunctivae are normal.  pupils equal ~32mm bilaterally  Neck:  Pt in c-collar  Cardiovascular: Normal heart sounds.  Tachycardia present.   Pulmonary/Chest: Effort normal and breath sounds normal. No respiratory distress.  Abdominal: She exhibits no distension.  Musculoskeletal:  Back is nml to inspection; no obvious step offs Not opening eyes to commands; resists manual attempts to open them  Occasional grunting   Withdraws all extremities to pain One arm raised above head pt will lower it herself Down going toes bilaterally   Skin: Skin is warm and dry.  No external signs of trauma  Nursing note and vitals reviewed.    ED Treatments / Results  DIAGNOSTIC STUDIES:  Oxygen Saturation is 98% on RA, normal by my interpretation.     Labs (all labs ordered  are listed, but only abnormal results are displayed) Labs Reviewed  COMPREHENSIVE METABOLIC PANEL - Abnormal; Notable for the following:       Result Value   Calcium 8.8 (*)    All other components within normal limits  CBC - Abnormal; Notable for the following:    WBC 12.1 (*)    All other components within normal limits  URINALYSIS, ROUTINE W REFLEX MICROSCOPIC - Abnormal; Notable for the following:    APPearance HAZY (*)    Hgb urine dipstick SMALL (*)    Leukocytes, UA MODERATE (*)    Bacteria, UA RARE (*)    Squamous Epithelial / LPF 6-30 (*)    All other components within normal limits  I-STAT CHEM 8, ED - Abnormal; Notable for the following:    Calcium, Ion 1.03 (*)    All other components within normal limits  ETHANOL  PROTIME-INR    LAMOTRIGINE LEVEL  LACTIC ACID, PLASMA  I-STAT CG4 LACTIC ACID, ED  TYPE AND SCREEN  PREPARE FRESH FROZEN PLASMA  ABO/RH    EKG  EKG Interpretation None       Radiology Ct Head Wo Contrast  Result Date: 07/18/2016 CLINICAL DATA:  Patient found unresponsive. EXAM: CT HEAD WITHOUT CONTRAST CT CERVICAL SPINE WITHOUT CONTRAST TECHNIQUE: Multidetector CT imaging of the head and cervical spine was performed following the standard protocol without intravenous contrast. Multiplanar CT image reconstructions of the cervical spine were also generated. COMPARISON:  None. FINDINGS: CT HEAD FINDINGS BRAIN: The ventricles and sulci are normal. No intraparenchymal hemorrhage, mass effect nor midline shift. No acute large vascular territory infarcts. No abnormal extra-axial fluid collections. Basal cisterns are midline and not effaced. No acute cerebellar abnormality. VASCULAR: No hyperdense vessels or unexpected calcifications. SKULL/SOFT TISSUES: No skull fracture. No significant soft tissue swelling. ORBITS/SINUSES: The included ocular globes and orbital contents are normal.The mastoid air-cells and included paranasal sinuses are well-aerated. OTHER: None. CT CERVICAL SPINE FINDINGS ALIGNMENT: Vertebral bodies in alignment. Maintained lordosis. SKULL BASE AND VERTEBRAE: Cervical vertebral bodies and posterior elements are intact. Intervertebral disc heights preserved. No destructive bony lesions. C1-2 articulation maintained. SOFT TISSUES AND SPINAL CANAL: Normal. DISC LEVELS: No significant osseous canal stenosis or neural foraminal narrowing. UPPER CHEST: Lung apices are clear. OTHER: None. IMPRESSION: 1. No acute intracranial abnormality. 2. No acute cervical spine fracture or posttraumatic subluxation. Electronically Signed   By: Tollie Ethavid  Kwon M.D.   On: 07/18/2016 19:59   Ct Cervical Spine Wo Contrast  Result Date: 07/18/2016 CLINICAL DATA:  Patient found unresponsive. EXAM: CT HEAD WITHOUT CONTRAST CT  CERVICAL SPINE WITHOUT CONTRAST TECHNIQUE: Multidetector CT imaging of the head and cervical spine was performed following the standard protocol without intravenous contrast. Multiplanar CT image reconstructions of the cervical spine were also generated. COMPARISON:  None. FINDINGS: CT HEAD FINDINGS BRAIN: The ventricles and sulci are normal. No intraparenchymal hemorrhage, mass effect nor midline shift. No acute large vascular territory infarcts. No abnormal extra-axial fluid collections. Basal cisterns are midline and not effaced. No acute cerebellar abnormality. VASCULAR: No hyperdense vessels or unexpected calcifications. SKULL/SOFT TISSUES: No skull fracture. No significant soft tissue swelling. ORBITS/SINUSES: The included ocular globes and orbital contents are normal.The mastoid air-cells and included paranasal sinuses are well-aerated. OTHER: None. CT CERVICAL SPINE FINDINGS ALIGNMENT: Vertebral bodies in alignment. Maintained lordosis. SKULL BASE AND VERTEBRAE: Cervical vertebral bodies and posterior elements are intact. Intervertebral disc heights preserved. No destructive bony lesions. C1-2 articulation maintained. SOFT TISSUES AND SPINAL CANAL: Normal. DISC LEVELS: No  significant osseous canal stenosis or neural foraminal narrowing. UPPER CHEST: Lung apices are clear. OTHER: None. IMPRESSION: 1. No acute intracranial abnormality. 2. No acute cervical spine fracture or posttraumatic subluxation. Electronically Signed   By: Tollie Eth M.D.   On: 07/18/2016 19:59    Procedures Procedures (including critical care time)  CRITICAL CARE Performed by: Raeford Razor, MD   Total critical care time: 35 minutes Critical care time was exclusive of separately billable procedures and treating other patients. Critical care was necessary to treat or prevent imminent or life-threatening deterioration. Critical care was time spent personally by me on the following activities: development of treatment plan  with patient and/or surrogate as well as nursing, discussions with consultants, evaluation of patient's response to treatment, examination of patient, obtaining history from patient or surrogate, ordering and performing treatments and interventions, ordering and review of laboratory studies, ordering and review of radiographic studies, pulse oximetry and re-evaluation of patient's condition.  Medications Ordered in ED Medications - No data to display   Initial Impression / Assessment and Plan / ED Course  I have reviewed the triage vital signs and the nursing notes.  Pertinent labs & imaging results that were available during my care of the patient were reviewed by me and considered in my medical decision making (see chart for details).     31 year old female with altered mental status.  Reportedly, fell and struck her head earlier today.  She has no external signs of trauma on exam.  CT head and cervical spine are without acute abnormality.  Currently, she still is poorly responsive, although her exam is very consistent.  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  She is afebrile.  She is eating and clear.  Stable aside from sinus tachycardia in the 110s.  Basic labs unremarkable aside from mild leukocytosis but this is nonspecific.  9:17 PM Fianc now at bedside. Additional history.  He saw her.  He relates the bathroom early this morning prior to him going to work and she seemed to be in her normal state of health.  During the day.  She called him that he missed the cold.  When he called her back.  She seemed confused and see that she had fallen and struck her head against a concrete surface.  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  She is on Lamictal, but it sounds like she takes this for psychiatric reasons.  He is not concerned for any ingestion. Or drug use.   Evaluated by neurology. Suspect may be psychogenic. Now awake, talking and able  to get up out of bed. She feels comfortable with DC.    Final Clinical Impressions(s) / ED Diagnoses   Final diagnoses:  Pseudoseizures    New Prescriptions New Prescriptions   No medications on file   I personally preformed the services scribed in my presence. The recorded information has been reviewed is accurate. Raeford Razor, MD.     Raeford Razor, MD 07/24/16 [phone]

## 2016-07-18 NOTE — ED Notes (Signed)
Dr. Kirkpatrick at bedside 

## 2016-07-18 NOTE — ED Notes (Signed)
Pt to CT

## 2016-07-18 NOTE — ED Notes (Signed)
Returned from CT.

## 2016-07-18 NOTE — ED Notes (Signed)
Patient ambulated from bed to bsc with steady gait and minimal assistance from family and staff.

## 2016-07-18 NOTE — Progress Notes (Signed)
Orthopedic Tech Progress Note Patient Details:  Sheila Cooper 1985/10/08 [id number] Level 2 trauma ortho visit. Patient ID: Sheila Cooper, female   DOB: 1985/10/08, 31 y.o.   MRN: [id number]   [name], [name] 07/18/2016, 8:35 PM

## 2016-07-18 NOTE — Consult Note (Signed)
Neurology Consultation Reason for Consult: Altered Mental Status  Referring Physician: Juleen China, S  CC: Altered Mental Status  History is obtained from:   HPI: Sheila Cooper is a 31 y.o. female with a history of pseudoseizures(though I do not have records) who presents with episodes of LOC earlier today. Her fianc states that she called him and said that she had fallen and hit her head and then crawled back into the house. He didn't try to get ahold of her and she did not answer and so neighbor came over and found her on the ground. EMS was called, and apparently she was alert and oriented when EMS arrived but she came unresponsive en route.  On arrival, she did open eyes but only grunt and response. She was unresponsive for some time, but by the time that I saw her she was responding.  When I ask her questions, she repeatedly states "I don't know", even autobiographical data such as her name. She is able to follow commands informed complex sentences without evidence of aphasia.   ROS: A 14 point ROS was performed and is negative except as noted in the HPI.   Past medical history:  Head injury at age 19, though no sequela seen on MRI 2013 Psychiatric disease Pseudoseizures  Family history: No history of epilepsy to the knowledge of the fianc(patient states I don't know)  Social History:  Per the fianc she does not drink, do drugs  Exam: Current vital signs: BP (!) 146/101   Pulse (!) 116   Temp 97.8 F (36.6 C) (Temporal)   Resp 18   Ht 5\' 4"  (1.626 m)   Wt 99.8 kg (220 lb)   SpO2 100%   BMI 37.76 kg/m  Vital signs in last 24 hours: Temp:  [97.8 F (36.6 C)] 97.8 F (36.6 C) (06/05 1927) Pulse Rate:  [105-118] 116 (06/05 2115) Resp:  [14-20] 18 (06/05 2115) BP: (122-151)/(80-110) 146/101 (06/05 2115) SpO2:  [98 %-100 %] 100 % (06/05 2115) Weight:  [99.8 kg (220 lb)] 99.8 kg (220 lb) (06/05 1931)   Physical Exam  Constitutional: Appears well-developed and  well-nourished.  Psych: Affect appropriate to situation Eyes: No scleral injection HENT: No OP obstrucion Head: Normocephalic.  Cardiovascular: Normal rate and regular rhythm.  Respiratory: Effort normal and breath sounds normal to anterior ascultation GI: Soft.  No distension. There is no tenderness.  Skin: WDI  Neuro: Mental Status: Patient is awake, alert, she answers questions without opening her teeth. She speaks in a low voice, but is not dysarthric. He answers I don't know to any autobiographical information. Cranial Nerves: II: Visual Fields are full. Pupils are equal, round, and reactive to light.   III,IV, VI: EOMI without ptosis or diploplia.  V: Facial sensation is symmetric to temperature VII: Facial movement is symmetric.  VIII: hearing is intact to voice X: Uvula elevates symmetrically XI: Shoulder shrug is symmetric. XII: tongue is midline without atrophy or fasciculations.  Motor: She gives poor effort throughout, but no clear lateralizing features. She has an inconsistent exam. Sensory: Sensation is symmetric to light touch and temperature in the arms and legs. Deep Tendon Reflexes: 2+ and symmetric in the biceps and patellae.  Cerebellar: She initially has tremor on finger-nose-finger, when performing with her right hand I asked her to open and close her left hand and the tremor went away   I have reviewed labs in epic and the results pertinent to this consultation are: Chem 8-unremarkable  I have reviewed  the images obtained: CT head-unremarkable MRI brain 2013-unremarkable  Impression: 31 year old female with a history of pseudoseizures who presents with several episodes of unresponsiveness with features on exam concerning for nonorganic etiology (character of her autobiographical memory loss, distractible tremor, inconsistent exam). My suspicion at this time is that this represents a manifestation of her underlying conversion disorder as opposed to any new  true pathology. That being said, without access to her records, it would be reasonable to perform an EEG with provocative testing tomorrow if the patient is still here. If she continues to improve, and returns to her baseline then I think she can follow-up with whoever typically manages her pseudoseizures.  Recommendations: 1) consider EEG if the patient is admitted   Ritta SlotMcNeill Celena Lanius, MD [facility] [phone]  If 7pm- 7am, please page neurology on call as listed in AMION.

## 2016-07-18 NOTE — ED Notes (Signed)
Pt arrived via EMS from home after reporting a fall to her boyfriend. The patient was home all day alone and when her boyfriend called to check on her she told him that she fell and unknown height and hit her head on concrete. The patient was alert and oriented at that time, and she did not indicate a time that she fell. After he arrived home they called EMS. Pt was initially alert and oriented and reported the same events to EMS, then became responsive to painful stimuli only during transport. On arrival the patient responds to painful stimuli and will open eyes, but does not use words, only grunts in response. She reported head and neck pain to EMS, does not answer questions in the ED. No signs of injury. Normal reflexes noted, but does not answer about sensation or follow commands. Pt had recent change in her psych medication in the last week, was stopped on one medication and started on another.

## 2016-07-19 LAB — TYPE AND SCREEN
ABO/RH(D): O NEG
Antibody Screen: NEGATIVE
Unit division: 0
Unit division: 0

## 2016-07-19 LAB — BPAM RBC
Blood Product Expiration Date: 201806262359
Blood Product Expiration Date: 201806282359
ISSUE DATE / TIME: 201806051923
ISSUE DATE / TIME: 201806051923
Unit Type and Rh: 9500
Unit Type and Rh: 9500

## 2016-07-20 LAB — LAMOTRIGINE LEVEL: Lamotrigine Lvl: 2.9 ug/mL (ref 2.0–20.0)

## 2016-07-21 ENCOUNTER — Encounter: Payer: Self-pay | Admitting: Neurology

## 2016-07-23 ENCOUNTER — Emergency Department (HOSPITAL_COMMUNITY)
Admission: EM | Admit: 2016-07-23 | Discharge: 2016-07-24 | Disposition: A | Discharge status: Home / Self Care | Payer: Medicaid Other | Attending: Emergency Medicine | Admitting: Emergency Medicine

## 2016-07-23 ENCOUNTER — Encounter (HOSPITAL_COMMUNITY): Payer: Self-pay | Admitting: Emergency Medicine

## 2016-07-23 ENCOUNTER — Emergency Department (HOSPITAL_COMMUNITY): Payer: Medicaid Other

## 2016-07-23 DIAGNOSIS — R569 Unspecified convulsions: Secondary | ICD-10-CM | POA: Diagnosis not present

## 2016-07-23 DIAGNOSIS — W0110XA Fall on same level from slipping, tripping and stumbling with subsequent striking against unspecified object, initial encounter: Secondary | ICD-10-CM | POA: Diagnosis not present

## 2016-07-23 DIAGNOSIS — Y93F1 Activity, caregiving, bathing: Secondary | ICD-10-CM | POA: Insufficient documentation

## 2016-07-23 DIAGNOSIS — S40911A Unspecified superficial injury of right shoulder, initial encounter: Secondary | ICD-10-CM | POA: Diagnosis present

## 2016-07-23 DIAGNOSIS — Y92012 Bathroom of single-family (private) house as the place of occurrence of the external cause: Secondary | ICD-10-CM | POA: Diagnosis not present

## 2016-07-23 DIAGNOSIS — S0990XA Unspecified injury of head, initial encounter: Secondary | ICD-10-CM | POA: Diagnosis not present

## 2016-07-23 DIAGNOSIS — Y999 Unspecified external cause status: Secondary | ICD-10-CM | POA: Diagnosis not present

## 2016-07-23 DIAGNOSIS — Z79899 Other long term (current) drug therapy: Secondary | ICD-10-CM | POA: Diagnosis not present

## 2016-07-23 DIAGNOSIS — S40011A Contusion of right shoulder, initial encounter: Secondary | ICD-10-CM | POA: Insufficient documentation

## 2016-07-23 HISTORY — DX: Unspecified intracranial injury with loss of consciousness status unknown, initial encounter: S06.9XAA

## 2016-07-23 HISTORY — DX: Unspecified intracranial injury with loss of consciousness of unspecified duration, initial encounter: S06.9X9A

## 2016-07-23 NOTE — ED Triage Notes (Addendum)
Patient is from home, history of three falls in the last week.  She was here had CT sent home, [facility], had a CT and sent home.  Patient having some confusion more than usual per SO.  Stroke Scale by EMS is negative.  The confusion has been going on for about a week.  Patient had a positive LOC for all three falls.  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  She is having right shoulder pain.

## 2016-07-24 MED ORDER — KETOROLAC TROMETHAMINE 15 MG/ML IJ SOLN
15.0000 mg | Freq: Once | INTRAMUSCULAR | Status: AC
  Administered 2016-07-24: 15 mg via INTRAMUSCULAR
  Filled 2016-07-24: qty 1

## 2016-07-24 MED ORDER — ACETAMINOPHEN 325 MG PO TABS
650.0000 mg | ORAL_TABLET | Freq: Once | ORAL | Status: AC
  Administered 2016-07-24: 650 mg via ORAL
  Filled 2016-07-24: qty 2

## 2016-07-24 NOTE — ED Notes (Signed)
Returned from xray

## 2016-07-24 NOTE — ED Notes (Signed)
Patient transported to X-ray 

## 2016-07-24 NOTE — Discharge Instructions (Signed)
Please see the Neurologist as planned.

## 2016-07-27 ENCOUNTER — Encounter (HOSPITAL_COMMUNITY): Payer: Self-pay | Admitting: Emergency Medicine

## 2016-07-27 ENCOUNTER — Emergency Department (HOSPITAL_COMMUNITY)
Admission: EM | Admit: 2016-07-27 | Discharge: 2016-07-28 | Disposition: A | Discharge status: Home / Self Care | Payer: Medicaid Other | Attending: Emergency Medicine | Admitting: Emergency Medicine

## 2016-07-27 DIAGNOSIS — R112 Nausea with vomiting, unspecified: Secondary | ICD-10-CM | POA: Insufficient documentation

## 2016-07-27 DIAGNOSIS — M79605 Pain in left leg: Secondary | ICD-10-CM | POA: Diagnosis not present

## 2016-07-27 DIAGNOSIS — Z79899 Other long term (current) drug therapy: Secondary | ICD-10-CM | POA: Diagnosis not present

## 2016-07-27 DIAGNOSIS — Z91013 Allergy to seafood: Secondary | ICD-10-CM | POA: Diagnosis not present

## 2016-07-27 DIAGNOSIS — R569 Unspecified convulsions: Secondary | ICD-10-CM | POA: Diagnosis present

## 2016-07-27 DIAGNOSIS — M79604 Pain in right leg: Secondary | ICD-10-CM | POA: Diagnosis not present

## 2016-07-27 DIAGNOSIS — Z91030 Bee allergy status: Secondary | ICD-10-CM | POA: Diagnosis not present

## 2016-07-27 DIAGNOSIS — R197 Diarrhea, unspecified: Secondary | ICD-10-CM | POA: Insufficient documentation

## 2016-07-27 LAB — CBC WITH DIFFERENTIAL/PLATELET
Basophils Absolute: 0 10*3/uL (ref 0.0–0.1)
Basophils Relative: 0 %
EOS ABS: 0.1 10*3/uL (ref 0.0–0.7)
Eosinophils Relative: 0 %
HCT: 42.5 % (ref 36.0–46.0)
HEMOGLOBIN: 13.9 g/dL (ref 12.0–15.0)
Lymphocytes Relative: 12 %
Lymphs Abs: 2.2 10*3/uL (ref 0.7–4.0)
MCH: 26.8 pg (ref 26.0–34.0)
MCHC: 32.7 g/dL (ref 30.0–36.0)
MCV: 81.9 fL (ref 78.0–100.0)
MONO ABS: 0.7 10*3/uL (ref 0.1–1.0)
MONOS PCT: 4 %
NEUTROS PCT: 84 %
Neutro Abs: 14.8 10*3/uL — ABNORMAL HIGH (ref 1.7–7.7)
PLATELETS: 308 10*3/uL (ref 150–400)
RBC: 5.19 MIL/uL — ABNORMAL HIGH (ref 3.87–5.11)
RDW: 13.2 % (ref 11.5–15.5)
WBC: 17.8 10*3/uL — ABNORMAL HIGH (ref 4.0–10.5)

## 2016-07-27 MED ORDER — GI COCKTAIL ~~LOC~~
30.0000 mL | Freq: Once | ORAL | Status: AC
  Administered 2016-07-27: 30 mL via ORAL
  Filled 2016-07-27: qty 30

## 2016-07-27 MED ORDER — SODIUM CHLORIDE 0.9 % IV BOLUS (SEPSIS)
1000.0000 mL | Freq: Once | INTRAVENOUS | Status: AC
  Administered 2016-07-27: 1000 mL via INTRAVENOUS

## 2016-07-27 MED ORDER — ONDANSETRON HCL 4 MG/2ML IJ SOLN
4.0000 mg | Freq: Once | INTRAMUSCULAR | Status: AC
  Administered 2016-07-27: 4 mg via INTRAVENOUS
  Filled 2016-07-27: qty 2

## 2016-07-27 NOTE — ED Triage Notes (Signed)
Per EMS, pt has had generalized weakness for the past week.  She began to have a seizure in the car on the highway.  She has a hx of seizures and TBI.  Pt is responsive to pain and voice, is able to track w/ eyes and when prompted pt is able to talk.  She appears to have a "tick" but was able to hold still when the IV was being started.  It is reported that pt was recently taken off a majority of her medications.

## 2016-07-27 NOTE — ED Provider Notes (Signed)
[facility]EM[facility]Y DEPT Provider Note   CSN: [id number] Arrival date & time: 07/27/16  2309  By signing my name below, I, Sheila Cooper, attest that this documentation has been prepared under the direction and in the presence of Sheila Cooper, Elize Pinon, DO. Electronically Signed: Rosana Fretana Cooper, ED Scribe. 07/27/16. 11:26 PM.  History   Chief Complaint Chief Complaint  Patient presents with  . Seizures   The history is provided by the patient and the EMS personnel. No language interpreter was used.   HPI Comments: Sheila Cooper is a 31 y.o. female with a PMHx of pseudoseizures and TBI, who presents to the Emergency Department via EMS complaining of moderate, generalized abdominal pain onset earlier today. Pt reports associated weakness, nausea, vomiting, and diarrhea. Per EMS, she was in the car when she began to feel very weak. Pt was seen in the ED 4 days ago after she sustained a fall. Pt has also been taken off several of her medications in the past couple of days. No treatments tried prior to arrival. Pt denies fever or any other complaints at this time.  Past Medical History:  Diagnosis Date  . Anxiety   . Bipolar 1 disorder (HCC)   . Migraines   . Seizures (HCC)   . TBI (traumatic brain injury) (HCC)     There are no active problems to display for this patient.   Past Surgical History:  Procedure Laterality Date  . NOSE SURGERY    . TUBAL LIGATION      OB History    No data available       Home Medications    Prior to Admission medications   Medication Sig Start Date End Date Taking? Authorizing Provider  EPINEPHrine (EPIPEN 2-PAK) 0.3 mg/0.3 mL IJ SOAJ injection Inject 0.3 mg into the muscle as needed (for allergic reaction).   Yes [provider]  lamoTRIgine (LAMICTAL) 150 MG tablet Take 150 mg by mouth daily.   Yes [provider]  meloxicam (MOBIC) 7.5 MG tablet Take 7.5 mg by mouth daily.   Yes [provider]  zolmitriptan (ZOMIG-ZMT) 5  MG disintegrating tablet Take 5 mg by mouth daily as needed for migraine.  07/22/16  Yes [provider]  benzonatate (TESSALON) 200 MG capsule Take 1 capsule (200 mg total) by mouth 3 (three) times daily as needed for cough. Patient not taking: Reported on 07/28/2016 12/28/14   Cuthriell, Delorise RoyalsJonathan D, PA-C  magic mouthwash w/lidocaine SOLN Take 5 mLs by mouth 4 (four) times daily. Patient not taking: Reported on 07/28/2016 12/28/14   Cuthriell, Delorise RoyalsJonathan D, PA-C  metoCLOPramide (REGLAN) 10 MG tablet Take 1 tablet (10 mg total) by mouth every 8 (eight) hours as needed for nausea or vomiting. Patient not taking: Reported on 07/28/2016 01/06/15   Sharyn CreamerQuale, Mark, MD  ondansetron (ZOFRAN ODT) 4 MG disintegrating tablet Take 1 tablet (4 mg total) by mouth every 8 (eight) hours as needed for nausea or vomiting. 07/28/16   Sheila Cooper, Heiley Shaikh, DO  traMADol (ULTRAM) 50 MG tablet Take 1 tablet (50 mg total) by mouth every 6 (six) hours as needed. Patient not taking: Reported on 07/28/2016 10/19/14   Chinita Pesterriplett, Cari B, FNP    Family History No family history on file.  Social History Social History  Substance Use Topics  . Smoking status: Never Smoker  . Smokeless tobacco: Never Used  . Alcohol use No     Allergies   Bee venom; Ketorolac; Shellfish allergy; and Meperidine   Review of Systems  Review of Systems  Constitutional: Negative for chills and fever.  HENT: Negative for congestion and rhinorrhea.   Eyes: Negative for redness and visual disturbance.  Respiratory: Negative for shortness of breath and wheezing.   Cardiovascular: Negative for chest pain and palpitations.  Gastrointestinal: Positive for abdominal pain, diarrhea, nausea and vomiting.  Genitourinary: Negative for dysuria and urgency.  Musculoskeletal: Negative for arthralgias and myalgias.  Skin: Negative for pallor and wound.  Neurological: Positive for weakness. Negative for dizziness and headaches.     Physical Exam Updated Vital  Signs BP (!) 144/99   Pulse 95   Temp 98.3 F (36.8 C) (Oral)   Resp 17   SpO2 96%   Physical Exam  Constitutional: She is oriented to person, place, and time. She appears well-developed and well-nourished. No distress.  HENT:  Head: Normocephalic and atraumatic.  Eyes: EOM are normal. Pupils are equal, round, and reactive to light.  Neck: Normal range of motion. Neck supple.  Cardiovascular: Normal rate and regular rhythm.  Exam reveals no gallop and no friction rub.   No murmur heard. Pulmonary/Chest: Effort normal. She has no wheezes. She has no rales.  Abdominal: Soft. She exhibits no distension. There is tenderness.  Diffuse abdominal pain without focality.   Musculoskeletal: She exhibits no edema or tenderness.  Neurological: She is alert and oriented to person, place, and time.  Skin: Skin is warm and dry. She is not diaphoretic.  Psychiatric: She has a normal mood and affect. Her behavior is normal.  Nursing note and vitals reviewed.    ED Treatments / Results  DIAGNOSTIC STUDIES: Oxygen Saturation is 96% on RA, normal by my interpretation.   COORDINATION OF CARE: 11:17 PM-Discussed next steps with pt including IV fluids. Pt verbalized understanding and is agreeable with the plan.   Labs (all labs ordered are listed, but only abnormal results are displayed) Labs Reviewed  CBC WITH DIFFERENTIAL/PLATELET - Abnormal; Notable for the following:       Result Value   WBC 17.8 (*)    RBC 5.19 (*)    Neutro Abs 14.8 (*)    All other components within normal limits  COMPREHENSIVE METABOLIC PANEL - Abnormal; Notable for the following:    Glucose, Bld 123 (*)    All other components within normal limits  LIPASE, BLOOD  I-STAT BETA HCG BLOOD, ED ([facility], [facility], [facility] ONLY)    EKG  EKG Interpretation None       Radiology No results found.  Procedures Procedures (including critical care time)  Medications Ordered in ED Medications  gi cocktail  (Maalox,Lidocaine,Donnatal) (30 mLs Oral Given 07/27/16 2348)  ondansetron (ZOFRAN) injection 4 mg (4 mg Intravenous Given 07/27/16 2349)  sodium chloride 0.9 % bolus 1,000 mL (0 mLs Intravenous Stopped 07/28/16 0147)  ketorolac (TORADOL) 30 MG/ML injection 30 mg (30 mg Intravenous Given 07/28/16 0147)  acetaminophen (TYLENOL) tablet 1,000 mg (1,000 mg Oral Given 07/28/16 0147)     Initial Impression / Assessment and Plan / ED Course  I have reviewed the triage vital signs and the nursing notes.  Pertinent labs & imaging results that were available during my care of the patient were reviewed by me and considered in my medical decision making (see chart for details).     31 yo F With a complaint of nausea vomiting and diarrhea. Going on for the past couple days. Complaining of diffuse abdominal pain as well. This was all obtained and review of systems is the patient's chief complaint  was pseudoseizures. She stopped seizing to tell me these other complaints. She had a leukocytosis but otherwise no significant finding. No focal abdominal tenderness. Upon being told she is being discharged and complained of bilateral leg pain. I found no red flags I feel she is safe for discharge at this time. She has follow-up tomorrow with a general surgeon to evaluate for her hiatal hernia. I also discussed the need to follow-up with neurology. They have an appointment in a couple weeks.  5:36 AM:  I have discussed the diagnosis/risks/treatment options with the patient and family and believe the pt to be eligible for discharge home to follow-up with PCP, gen surgery, neuro. We also discussed returning to the ED immediately if new or worsening sx occur. We discussed the sx which are most concerning (e.g., sudden worsening pain, fever, inability to tolerate by mouth) that necessitate immediate return. Medications administered to the patient during their visit and any new prescriptions provided to the patient are listed  below.  Medications given during this visit Medications  gi cocktail (Maalox,Lidocaine,Donnatal) (30 mLs Oral Given 07/27/16 2348)  ondansetron (ZOFRAN) injection 4 mg (4 mg Intravenous Given 07/27/16 2349)  sodium chloride 0.9 % bolus 1,000 mL (0 mLs Intravenous Stopped 07/28/16 0147)  ketorolac (TORADOL) 30 MG/ML injection 30 mg (30 mg Intravenous Given 07/28/16 0147)  acetaminophen (TYLENOL) tablet 1,000 mg (1,000 mg Oral Given 07/28/16 0147)     The patient appears reasonably screen and/or stabilized for discharge and I doubt any other medical condition or other [facility] requiring further screening, evaluation, or treatment in the ED at this time prior to discharge.   Final Clinical Impressions(s) / ED Diagnoses   Final diagnoses:  Nausea vomiting and diarrhea  Leg pain, bilateral    New Prescriptions Discharge Medication List as of 07/28/2016  1:00 AM    START taking these medications   Details  ondansetron (ZOFRAN ODT) 4 MG disintegrating tablet Take 1 tablet (4 mg total) by mouth every 8 (eight) hours as needed for nausea or vomiting., Starting Fri 07/28/2016, Print        I personally performed the services described in this documentation, which was scribed in my presence. The recorded information has been reviewed and is accurate.     Sheila Plan, DO 07/28/16 [phone]

## 2016-07-27 NOTE — ED Provider Notes (Signed)
[facility]PT Provider Note   CSN: [id number] Arrival date & time: 07/23/16  2138     History   Chief Complaint Chief Complaint  Patient presents with  . Fall    HPI Sheila Cooper is a 31 y.o. female.  HPI  Pt comes in with cc of fall. Pt has hx of pseudoseizures, psychiatric condition and TBI. She is brought in by the fiance with cc of fall. Pt has had recurrent falls over the last few days. Today she passed out while she was showering. Pt's pseudoseizures presents with fainting spell - there is typically no tonic-clonic movements. Pt denies any prodrome before she fainted. She has no known cardiac hx. Pt c/o shoulder pain, R side. Pt is on 2 or 3 sleep meds per fiance. She is not on any new meds.  Past Medical History:  Diagnosis Date  . Anxiety   . Bipolar 1 disorder (HCC)   . Migraines   . Seizures (HCC)   . TBI (traumatic brain injury) (HCC)     There are no active problems to display for this patient.   Past Surgical History:  Procedure Laterality Date  . NOSE SURGERY    . TUBAL LIGATION      OB History    No data available       Home Medications    Prior to Admission medications   Medication Sig Start Date End Date Taking? Authorizing Provider  ARIPiprazole (ABILIFY) 2 MG tablet Take 1 mg by mouth daily.    [provider]  benzonatate (TESSALON) 200 MG capsule Take 1 capsule (200 mg total) by mouth 3 (three) times daily as needed for cough. 12/28/14   Cuthriell, Delorise Royals, PA-C  clonazePAM (KLONOPIN) 0.5 MG tablet Take 0.5 mg by mouth 2 (two) times daily as needed for anxiety.    [provider]  doxepin (SINEQUAN) 10 MG capsule Take 20 mg by mouth every evening.    [provider]  gabapentin (NEURONTIN) 400 MG capsule Take 400 mg by mouth 2 (two) times daily.    [provider]  lamoTRIgine (LAMICTAL) 150 MG tablet Take 150 mg by mouth daily.    [provider]  magic mouthwash w/lidocaine SOLN  Take 5 mLs by mouth 4 (four) times daily. 12/28/14   Cuthriell, Delorise Royals, PA-C  meloxicam (MOBIC) 7.5 MG tablet Take 7.5 mg by mouth daily.    [provider]  metoCLOPramide (REGLAN) 10 MG tablet Take 1 tablet (10 mg total) by mouth every 8 (eight) hours as needed for nausea or vomiting. 01/06/15   Sharyn Creamer, MD  pantoprazole (PROTONIX) 40 MG tablet Take 40 mg by mouth 2 (two) times daily.    [provider]  QUEtiapine (SEROQUEL) 50 MG tablet Take 50 mg by mouth at bedtime.    [provider]  ranitidine (ZANTAC) 150 MG capsule Take 150 mg by mouth every evening.    [provider]  topiramate (TOPAMAX) 100 MG tablet Take 100 mg by mouth 2 (two) times daily.    [provider]  traMADol (ULTRAM) 50 MG tablet Take 1 tablet (50 mg total) by mouth every 6 (six) hours as needed. 10/19/14   Triplett, Kasandra Knudsen, FNP    Family History No family history on file.  Social History Social History  Substance Use Topics  . Smoking status: Never Smoker  . Smokeless tobacco: Never Used  . Alcohol use No     Allergies   Bee venom  and Shellfish allergy   Review of Systems Review of Systems  Constitutional: Positive for activity change.  Respiratory: Negative for chest tightness and shortness of breath.   Cardiovascular: Negative for chest pain.  Gastrointestinal: Negative for abdominal pain, nausea and vomiting.  Musculoskeletal: Positive for arthralgias and joint swelling. Negative for back pain and neck pain.  Skin: Negative for wound.  Neurological: Positive for syncope and headaches.  Hematological: Does not bruise/bleed easily.     Physical Exam Updated Vital Signs BP 122/82   Pulse 94   Temp 98.8 F (37.1 C) (Oral)   Resp 16   SpO2 96%   Physical Exam  Constitutional: She is oriented to person, place, and time. She appears well-developed.  HENT:  Head: Normocephalic and atraumatic.  Eyes: EOM are normal.  Neck: Normal range of  motion. Neck supple.  Cardiovascular: Normal rate.   Pulmonary/Chest: Effort normal.  Abdominal: Bowel sounds are normal.  Musculoskeletal:  Pt has tenderness over the R shoulder. She has no increased swelling and there is no clear signs of dislocation. Otherwise:  Head to toe evaluation shows no hematoma, bleeding of the scalp, no facial abrasions, no spine step offs, crepitus of the chest or neck, no tenderness to palpation of the bilateral upper and lower extremities, no gross deformities, no chest tenderness, no pelvic pain.   Neurological: She is alert and oriented to person, place, and time. No cranial nerve deficit. Coordination normal.  Skin: Skin is warm and dry.  Nursing note and vitals reviewed.    ED Treatments / Results  Labs (all labs ordered are listed, but only abnormal results are displayed) Labs Reviewed - No data to display  EKG  EKG Interpretation None       Radiology No results found.  Procedures Procedures (including critical care time)  Medications Ordered in ED Medications  ketorolac (TORADOL) 15 MG/ML injection 15 mg (15 mg Intramuscular Given 07/24/16 0054)  acetaminophen (TYLENOL) tablet 650 mg (650 mg Oral Given 07/24/16 0053)     Initial Impression / Assessment and Plan / ED Course  I have reviewed the triage vital signs and the nursing notes.  Pertinent labs & imaging results that were available during my care of the patient were reviewed by me and considered in my medical decision making (see chart for details).    Pt comes in with cc of fall. Hard to tell it was a fall due to syncope / pseudoseizure or a mechanical fall and due to concussion pt has amnesia about the event leading upto the fall. She has no cardiac hx. We will monitor on our tele. She has had 2 CT scans of the head in the last week. I am concerned about Ms. Solomon's CT exposure -so we will observe her and not CT her at this point - since the neuro exam is non focal and my  pretest probability for brain bleed is low. Strict ER return precautions have been discussed, and patient is agreeing with the plan and is comfortable with the workup done and the recommendations from the ER.    Final Clinical Impressions(s) / ED Diagnoses   Final diagnoses:  Injury of head, initial encounter  Contusion of right shoulder, initial encounter    New Prescriptions Discharge Medication List as of 07/24/2016 12:42 AM       Derwood KaplanNanavati, Kimba Lottes, MD 07/27/16 1135

## 2016-07-28 LAB — COMPREHENSIVE METABOLIC PANEL
ALT: 16 U/L (ref 14–54)
ANION GAP: 9 (ref 5–15)
AST: 33 U/L (ref 15–41)
Albumin: 4.2 g/dL (ref 3.5–5.0)
Alkaline Phosphatase: 99 U/L (ref 38–126)
BUN: 11 mg/dL (ref 6–20)
CO2: 24 mmol/L (ref 22–32)
Calcium: 9.3 mg/dL (ref 8.9–10.3)
Chloride: 103 mmol/L (ref 101–111)
Creatinine, Ser: 0.85 mg/dL (ref 0.44–1.00)
GLUCOSE: 123 mg/dL — AB (ref 65–99)
POTASSIUM: 3.8 mmol/L (ref 3.5–5.1)
Sodium: 136 mmol/L (ref 135–145)
TOTAL PROTEIN: 7.6 g/dL (ref 6.5–8.1)
Total Bilirubin: 1 mg/dL (ref 0.3–1.2)

## 2016-07-28 LAB — I-STAT BETA HCG BLOOD, ED (MC, WL, AP ONLY)

## 2016-07-28 LAB — LIPASE, BLOOD: LIPASE: 23 U/L (ref 11–51)

## 2016-07-28 MED ORDER — KETOROLAC TROMETHAMINE 30 MG/ML IJ SOLN
30.0000 mg | Freq: Once | INTRAMUSCULAR | Status: AC
  Administered 2016-07-28: 30 mg via INTRAVENOUS
  Filled 2016-07-28: qty 1

## 2016-07-28 MED ORDER — ONDANSETRON 4 MG PO TBDP: 4.0000 mg | ORAL_TABLET | Freq: Three times a day (TID) | ORAL | 0 refills | Status: DC | PRN

## 2016-07-28 MED ORDER — ACETAMINOPHEN 500 MG PO TABS
1000.0000 mg | ORAL_TABLET | Freq: Once | ORAL | Status: AC
  Administered 2016-07-28: 1000 mg via ORAL
  Filled 2016-07-28: qty 2

## 2016-07-28 NOTE — Discharge Instructions (Signed)
Imodium for diarrhea

## 2016-08-11 ENCOUNTER — Ambulatory Visit (INDEPENDENT_AMBULATORY_CARE_PROVIDER_SITE_OTHER): Payer: Medicaid Other | Admitting: Neurology

## 2016-08-11 ENCOUNTER — Other Ambulatory Visit: Payer: Medicaid Other

## 2016-08-11 ENCOUNTER — Encounter: Payer: Self-pay | Admitting: Neurology

## 2016-08-11 VITALS — BP 162/96 | HR 114 | Ht 65.0 in | Wt 218.0 lb

## 2016-08-11 DIAGNOSIS — F445 Conversion disorder with seizures or convulsions: Secondary | ICD-10-CM

## 2016-08-11 DIAGNOSIS — F339 Major depressive disorder, recurrent, unspecified: Secondary | ICD-10-CM

## 2016-08-11 DIAGNOSIS — R413 Other amnesia: Secondary | ICD-10-CM

## 2016-08-11 DIAGNOSIS — R55 Syncope and collapse: Secondary | ICD-10-CM

## 2016-08-11 LAB — TSH: TSH: 0.59 m[IU]/L
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
## 2016-08-11 NOTE — Patient Instructions (Addendum)
1. Bloodwork for TSH, B12 2. Schedule MRI brain with and without contrast  We have sent a [facility] to [facility] your [facility] and they will call you directly to schedule your appt. They are located at [address] If you need to contact them directly please call [phone].  3. Schedule 30-day holter monitor 4. Continue all your medications, continue follow-up with Psychiatry and therapy 5. Follow-up in 4-5 months, call for any changes

## 2016-08-12 LAB — VITAMIN B12: VITAMIN B 12: 374 pg/mL (ref 200–1100)

## 2016-08-14 ENCOUNTER — Telehealth: Payer: Self-pay

## 2016-08-14 NOTE — Telephone Encounter (Signed)
-----   Message from Van ClinesKaren M Aquino, MD sent at 08/14/2016  8:43 AM EDT ----- Pls let him know thyroid and B12 levels are normal. Thanks

## 2016-08-14 NOTE — Telephone Encounter (Signed)
LMOM relaying message below.  

## 2016-08-28 ENCOUNTER — Ambulatory Visit
Admission: RE | Admit: 2016-08-28 | Discharge: 2016-08-28 | Disposition: A | Discharge status: Home / Self Care | Payer: Medicaid Other | Source: Ambulatory Visit | Attending: Neurology | Admitting: Neurology

## 2016-08-28 DIAGNOSIS — R55 Syncope and collapse: Secondary | ICD-10-CM

## 2016-08-28 DIAGNOSIS — F445 Conversion disorder with seizures or convulsions: Secondary | ICD-10-CM

## 2016-08-28 DIAGNOSIS — R413 Other amnesia: Secondary | ICD-10-CM

## 2016-08-28 DIAGNOSIS — F339 Major depressive disorder, recurrent, unspecified: Secondary | ICD-10-CM

## 2016-08-28 MED ORDER — GADOBENATE DIMEGLUMINE 529 MG/ML IV SOLN
20.0000 mL | Freq: Once | INTRAVENOUS | Status: AC | PRN
  Administered 2016-08-28: 20 mL via INTRAVENOUS

## 2016-08-30 ENCOUNTER — Telehealth: Payer: Self-pay

## 2016-08-30 NOTE — Telephone Encounter (Signed)
-----   Message from Van ClinesKaren M Aquino, MD sent at 08/29/2016  2:06 PM EDT ----- Pls let her know MRI brain is normal, no evidence of tumor, stroke, or bleed. Thanks

## 2016-08-30 NOTE — Telephone Encounter (Signed)
Left detailed message relaying message below.

## 2016-08-31 ENCOUNTER — Ambulatory Visit (INDEPENDENT_AMBULATORY_CARE_PROVIDER_SITE_OTHER): Payer: Medicaid Other

## 2016-08-31 DIAGNOSIS — R55 Syncope and collapse: Secondary | ICD-10-CM

## 2016-08-31 DIAGNOSIS — F339 Major depressive disorder, recurrent, unspecified: Secondary | ICD-10-CM

## 2016-08-31 DIAGNOSIS — R413 Other amnesia: Secondary | ICD-10-CM | POA: Diagnosis not present

## 2016-08-31 DIAGNOSIS — F445 Conversion disorder with seizures or convulsions: Secondary | ICD-10-CM

## 2016-09-01 ENCOUNTER — Telehealth: Payer: Self-pay | Admitting: Neurology

## 2016-09-01 NOTE — Telephone Encounter (Signed)
PT called and said she is having problems with the heart monitor and needs a call back ASAP, she has to leave for an appointment and needs to know what to do

## 2016-09-01 NOTE — Telephone Encounter (Signed)
Thank you for your patience

## 2016-09-01 NOTE — Telephone Encounter (Signed)
Spoke with patient. She states she went to Cardiology yesterday to have heart monitor placed. It stayed off for a short amount of time but keeps falling off.  Patient states she called Cardiology and the nurse told her to contact [employer] (the company for holter monitor) with any problems.  She states she was told by [employer] to call Dr. Karel JarvisAquino to see if she needs to continue to wear monitor?   I told patient that would be strange - if trouble keeping monitor on she should probably speak with the office who placed it.   Dr. Karel JarvisAquino please advise. She currently has monitor off.

## 2016-09-01 NOTE — Telephone Encounter (Signed)
Called patient to relay the message.  I did not finish letting her know the entire message before she interrupted me and stated that she was irritated and she wasn't calling anyone else because it won't stick to her skin because of a skin condition.  I calmly tried to let her know that the point of contacting them was to come up with an alternative method to get it to stay, different tapes, etc.  She started yelling that she wasn't going to wear it or complete the test.  I let her know that it was Dr. Rosalyn GessAquino's recommendation, but we could not make her complete a test she was not comfortable with. I informed her she will need to contact [employer]/cardiology about turning the holter monitor back in.   Dr. Karel JarvisAquino - FYI.

## 2016-09-01 NOTE — Telephone Encounter (Signed)
Patient said she was returning your call. Thanks

## 2016-09-01 NOTE — Telephone Encounter (Signed)
Already spoke with patient

## 2016-09-01 NOTE — Telephone Encounter (Signed)
It would be ideal for her to continue wear the monitor, she should ask either [employer] or the Cardiology office what they can do to keep the monitor on, such as if there a different type of tape she can use? Thanks

## 2016-09-11 ENCOUNTER — Telehealth: Payer: Self-pay | Admitting: Neurology

## 2016-09-11 NOTE — Telephone Encounter (Signed)
Spoke with pt.  She is unsure if she will have someone available to bring her today, if not she will come tomorrow for cocktail.

## 2016-09-11 NOTE — Telephone Encounter (Signed)
Caller: Richa  Urgent? No  Reason for the call: She has a Migraine and would like for someone to call her. Please call. Thanks

## 2016-09-11 NOTE — Telephone Encounter (Signed)
Can she come in shots for a migraine cocktail? Someone has to drive her. Thanks

## 2016-09-11 NOTE — Telephone Encounter (Signed)
Spoke with pt.  She states that her head is pounding.  When asked if she had missed any medications her reply was "I don't take anything for migraines".  She says she has taken OTC Ibuprofen which is no help.  Please advise

## 2016-09-12 ENCOUNTER — Telehealth: Payer: Self-pay | Admitting: Neurology

## 2016-09-12 ENCOUNTER — Ambulatory Visit (INDEPENDENT_AMBULATORY_CARE_PROVIDER_SITE_OTHER): Payer: Medicaid Other

## 2016-09-12 DIAGNOSIS — I639 Cerebral infarction, unspecified: Secondary | ICD-10-CM

## 2016-09-12 DIAGNOSIS — G43611 Persistent migraine aura with cerebral infarction, intractable, with status migrainosus: Secondary | ICD-10-CM

## 2016-09-12 MED ORDER — METOCLOPRAMIDE HCL 5 MG/ML IJ SOLN: 10.0000 mg | Freq: Once | INTRAVENOUS | Status: AC

## 2016-09-12 MED ORDER — DIPHENHYDRAMINE HCL 50 MG/ML IJ SOLN
25.0000 mg | Freq: Once | INTRAMUSCULAR | Status: AC
  Administered 2016-09-12: 25 mg via INTRAMUSCULAR

## 2016-09-12 MED ORDER — KETOROLAC TROMETHAMINE 60 MG/2ML IM SOLN
60.0000 mg | Freq: Once | INTRAMUSCULAR | Status: AC
  Administered 2016-09-12: 60 mg via INTRAMUSCULAR

## 2016-09-12 NOTE — Telephone Encounter (Signed)
Caller:  Patient  Urgent? No  Reason for the call: She is wanting to know can she see Dr. Karel JarvisAquino sooner so she can get on a medication for her headahes. Please Advise. Thanks

## 2016-09-12 NOTE — Telephone Encounter (Signed)
Pt came into clinic today for headache cocktail.

## 2016-09-13 ENCOUNTER — Telehealth: Payer: Self-pay | Admitting: Neurology

## 2016-09-13 NOTE — Telephone Encounter (Signed)
Spoke with pt relaying message below.  She states that her PCP told her that he will not prescribe anything for headaches and that she would need to talk to her Neurologist because "that's what they handle" per pt.  Let her know that Dr. Karel JarvisAquino most likely would not prescribe something before seeing her on the 21st.  I let her know that I would send message and get back to her

## 2016-09-13 NOTE — Telephone Encounter (Signed)
Since this was not the reason I had seen her before, I would have to talk to her about the medications on her follow-up. If she needs something before then, would have her speak to her PCP. Thanks

## 2016-09-13 NOTE — Telephone Encounter (Signed)
Rescheduled pt for 8/21 @ 1:30pm.  Pt aware.  Pt states that her headache is "much better" since receiving her cocktail yesterday.  She would like to start medication for headache.  Dr. Karel JarvisAquino - is there anything you would recommend prior to her visit?

## 2016-09-13 NOTE — Telephone Encounter (Signed)
Patient is needing to speak with you. Please call. Thanks

## 2016-09-14 NOTE — Telephone Encounter (Signed)
Spoke with pt.  She states that she is having a headache.  I let her know that Dr. Karel JarvisAquino cannot prescribe anything for it until she is seen in office at the earliest.  (please see previous phone note)

## 2016-09-22 ENCOUNTER — Encounter: Payer: Self-pay | Admitting: Neurology

## 2016-09-22 ENCOUNTER — Ambulatory Visit (INDEPENDENT_AMBULATORY_CARE_PROVIDER_SITE_OTHER): Payer: Medicaid Other | Admitting: Neurology

## 2016-09-22 VITALS — BP 136/84 | HR 100 | Ht 65.0 in | Wt 208.0 lb

## 2016-09-22 DIAGNOSIS — R55 Syncope and collapse: Secondary | ICD-10-CM

## 2016-09-22 DIAGNOSIS — R413 Other amnesia: Secondary | ICD-10-CM

## 2016-09-22 DIAGNOSIS — I639 Cerebral infarction, unspecified: Secondary | ICD-10-CM | POA: Diagnosis not present

## 2016-09-22 DIAGNOSIS — F445 Conversion disorder with seizures or convulsions: Secondary | ICD-10-CM

## 2016-09-22 DIAGNOSIS — F339 Major depressive disorder, recurrent, unspecified: Secondary | ICD-10-CM | POA: Diagnosis not present

## 2016-09-22 DIAGNOSIS — G43611 Persistent migraine aura with cerebral infarction, intractable, with status migrainosus: Secondary | ICD-10-CM

## 2016-09-22 DIAGNOSIS — R0683 Snoring: Secondary | ICD-10-CM

## 2016-09-22 MED ORDER — VENLAFAXINE HCL 75 MG PO TABS: ORAL_TABLET | ORAL | 6 refills | Status: DC

## 2016-09-22 MED ORDER — PREDNISONE 20 MG PO TABS: ORAL_TABLET | ORAL | 0 refills | Status: DC
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
## 2016-09-22 NOTE — Patient Instructions (Addendum)
1. Start Effexor 75mg : Take 1/2 tablet twice a day for 2 weeks, then increase to 1 tablet twice a day. Let your psychiatrist know you started this medication.  2. Take Prednisone 20mg : Take 3 tabs on day 1, 2-1/2 tabs on day 2, 2 tabs on day 3, 1-1/2 tabs on day 4, 1 tab on day 5, 1/2 tab on days 6 and 7, then stop.  3. Refer to Cardiology for recurrent syncope/presyncope  4. Schedule for sleep study  5. Follow-up in 3 months, call for any changes

## 2016-09-25 ENCOUNTER — Telehealth: Payer: Self-pay

## 2016-09-25 ENCOUNTER — Other Ambulatory Visit: Payer: Self-pay

## 2016-09-25 DIAGNOSIS — R0683 Snoring: Secondary | ICD-10-CM

## 2016-09-25 NOTE — Telephone Encounter (Signed)
-----   Message from Van ClinesKaren M Aquino, MD sent at 09/22/2016  4:15 PM EDT ----- Pls fax note to her psychiatrist Dr. Leticia PennaZiegler, fax number at end of my note, thanks

## 2016-09-25 NOTE — Telephone Encounter (Signed)
Fax sent via EPIC

## 2016-09-28 ENCOUNTER — Telehealth: Payer: Self-pay | Admitting: Neurology

## 2016-09-28 NOTE — Telephone Encounter (Signed)
Talked to patient and informed her that the effexor has nothing to do with the seizures and it will take more time to build up in her system to take care of the migraines per Dr. Karel JarvisAquino.  Instructed her to continue with the medication as directed.

## 2016-09-28 NOTE — Telephone Encounter (Signed)
Sheila Cooper called in to say that she had 5 Seizures yesterday 09/27/16 and that her Migraines are not any better. She has been taking Effexor. Please Advise. Thanks

## 2016-10-02 ENCOUNTER — Ambulatory Visit (INDEPENDENT_AMBULATORY_CARE_PROVIDER_SITE_OTHER): Payer: Medicaid Other | Admitting: Cardiology

## 2016-10-02 ENCOUNTER — Encounter: Payer: Self-pay | Admitting: Cardiology

## 2016-10-02 VITALS — BP 128/88 | HR 121 | Ht 65.0 in | Wt 203.0 lb

## 2016-10-02 DIAGNOSIS — R002 Palpitations: Secondary | ICD-10-CM | POA: Diagnosis not present

## 2016-10-02 DIAGNOSIS — R55 Syncope and collapse: Secondary | ICD-10-CM
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
## 2016-10-02 NOTE — Addendum Note (Signed)
Addended by: Domingo Madeira on: 10/02/2016 02:23 PM   Modules accepted: Orders

## 2016-10-02 NOTE — Patient Instructions (Signed)
Medication Instructions:  Your physician recommends that you continue on your current medications as directed. Please refer to the Current Medication list given to you today.   Labwork: No lab work, but Dr. Tomie China would like to have a pregnacy test done prior to having Echocardiogram and Lexiscan done.   Testing/Procedures:  Your physician has requested that you have an echocardiogram. Echocardiography is a painless test that uses sound waves to create images of your heart. It provides your doctor with information about the size and shape of your heart and how well your heart's chambers and valves are working. This procedure takes approximately one hour. There are no restrictions for this procedure.  Your physician has requested that you have a lexiscan myoview. For further information please visit [email or web address] Please follow instruction sheet, as given.  Your physician has recommended that you wear a holter monitor. Holter monitors are medical devices that record the heart's electrical activity. Doctors most often use these monitors to diagnose arrhythmias. Arrhythmias are problems with the speed or rhythm of the heartbeat. The monitor is a small, portable device. You can wear one while you do your normal daily activities. This is usually used to diagnose what is causing palpitations/syncope (passing out).  All of these test will be done at [address], [place], [place] [address]  Your physician has recommended that you have a tilt table test. This test is sometimes used to help determine the cause of fainting spells. You lie on a table that moves from a lying down to an upright position. The change in position can bring on loss of consciousness. The doctor monitors your symptoms, heart rate, EKG, and blood pressure throughout the test. The doctor also may give you a medicine and then monitor your response to the medicine. This is done in the hospital and usually takes half  of a day to complete the procedure. Please see the instruction sheet given to you today for more information.  You will have a appt with one our electrophysiologist (EP) doctors before having the Tilt table study. Someone will contact you to schedule this appointment.     Follow-Up:  Your physician recommends that you schedule a follow-up appointment in: 6 months    Any Other Special Instructions Will Be Listed Below (If Applicable).     If you need a refill on your cardiac medications before your next appointment, please call your pharmacy.

## 2016-10-02 NOTE — Addendum Note (Signed)
Addended by: Domingo Madeira on: 10/02/2016 12:28 PM   Modules accepted: Orders

## 2016-10-03 ENCOUNTER — Ambulatory Visit: Payer: Medicaid Other | Admitting: Neurology

## 2016-10-03 LAB — PREGNANCY, URINE: PREG TEST UR: NEGATIVE

## 2016-10-12 ENCOUNTER — Telehealth (HOSPITAL_COMMUNITY): Payer: Self-pay | Admitting: *Deleted

## 2016-10-12 ENCOUNTER — Other Ambulatory Visit (HOSPITAL_COMMUNITY): Payer: Medicaid Other

## 2016-10-12 ENCOUNTER — Telehealth: Payer: Self-pay | Admitting: Neurology

## 2016-10-12 NOTE — Telephone Encounter (Signed)
Left message on voicemail per DPR in reference to upcoming appointment scheduled on 10/17/16 with detailed instructions given per Myocardial Perfusion Study Information Sheet for the test. LM to arrive 15 minutes early, and that it is imperative to arrive on time for appointment to keep from having the test rescheduled. If you need to cancel or reschedule your appointment, please call the office within 24 hours of your appointment. Failure to do so may result in a cancellation of your appointment, and a $50 no show fee. Phone number given for call back for any questions.  Ricky AlaSmith, Vivek Grealish Jacqueline

## 2016-10-12 NOTE — Telephone Encounter (Signed)
Patient lmom to have you please call her. She did not say what the reason was. Please Call. Thanks

## 2016-10-17 ENCOUNTER — Encounter (HOSPITAL_COMMUNITY): Payer: Self-pay

## 2016-10-17 ENCOUNTER — Other Ambulatory Visit: Payer: Self-pay

## 2016-10-17 ENCOUNTER — Ambulatory Visit (HOSPITAL_COMMUNITY): Payer: Medicaid Other | Attending: Cardiology

## 2016-10-17 ENCOUNTER — Ambulatory Visit (HOSPITAL_COMMUNITY): Payer: Medicaid Other

## 2016-10-17 ENCOUNTER — Ambulatory Visit: Payer: Medicaid Other

## 2016-10-17 ENCOUNTER — Ambulatory Visit (INDEPENDENT_AMBULATORY_CARE_PROVIDER_SITE_OTHER): Payer: Medicaid Other

## 2016-10-17 DIAGNOSIS — G43909 Migraine, unspecified, not intractable, without status migrainosus: Secondary | ICD-10-CM | POA: Insufficient documentation

## 2016-10-17 DIAGNOSIS — R569 Unspecified convulsions: Secondary | ICD-10-CM | POA: Diagnosis not present

## 2016-10-17 DIAGNOSIS — R55 Syncope and collapse: Secondary | ICD-10-CM

## 2016-10-17 DIAGNOSIS — R002 Palpitations: Secondary | ICD-10-CM

## 2016-10-20 ENCOUNTER — Telehealth: Payer: Self-pay | Admitting: Neurology

## 2016-10-20 NOTE — Telephone Encounter (Signed)
Patient called to leave a message for Dr. Karel JarvisAquino regarding her Effexor medication. Her Psychiatrist would like her to stop taking that medication. Please Advise. Thanks

## 2016-10-20 NOTE — Telephone Encounter (Signed)
Ok to stop  Thanks

## 2016-10-20 NOTE — Telephone Encounter (Signed)
Spoke to patient psychiatrist wanted her to stop Effexor because it clashes with her Lamictal. Pt wanted to know if it was ok to stop.

## 2016-10-20 NOTE — Telephone Encounter (Signed)
Left message on patients cell phone ok to stop Effexor.

## 2016-10-24 ENCOUNTER — Telehealth: Payer: Self-pay | Admitting: Neurology

## 2016-10-24 NOTE — Telephone Encounter (Signed)
Pt called in regards to getting a prescription called in for her headaches

## 2016-10-25 NOTE — Telephone Encounter (Signed)
At this point would recommend doing Botox treatment for the migraines. These would be injections in her scalp every 3 months, they help decrease the frequency and intensity of migraines. We will need to get approval first from her insurance, does she want us to proceed? Thanks

## 2016-10-25 NOTE — Telephone Encounter (Signed)
Spoke with pt, she states that she is still experiencing headaches and would like an Rx sent in for something that can help.  She was recently advised to stop her Effexor.

## 2016-10-25 NOTE — Telephone Encounter (Signed)
Spoke with pt relaying message below.  She says that she would like to take some time to think over Botox treatments.  I agreed.  She will call our office with her decision.

## 2016-11-03 ENCOUNTER — Telehealth: Payer: Self-pay | Admitting: Neurology

## 2016-11-03 NOTE — Telephone Encounter (Signed)
Patient would like call about holter results.cn

## 2016-11-03 NOTE — Telephone Encounter (Signed)
Pt left a message that she did not want to do Botox because of the side effects and insurance will not pay for it, she wants an alternative for the botox, please call and advise

## 2016-11-03 NOTE — Telephone Encounter (Signed)
Please review HM results so that pt could be advised.

## 2016-11-09 ENCOUNTER — Encounter (HOSPITAL_BASED_OUTPATIENT_CLINIC_OR_DEPARTMENT_OTHER): Payer: Medicaid Other

## 2016-11-09 ENCOUNTER — Telehealth: Payer: Self-pay | Admitting: Neurology

## 2016-11-09 NOTE — Telephone Encounter (Signed)
Patient would like for you to please call her regarding her headaches. Please Call. Thanks

## 2016-11-09 NOTE — Telephone Encounter (Signed)
LMOM asking pt to return call to the office 

## 2016-11-10 ENCOUNTER — Ambulatory Visit: Payer: Self-pay | Admitting: Neurology

## 2016-11-24 ENCOUNTER — Telehealth: Payer: Self-pay

## 2016-11-24 ENCOUNTER — Encounter: Payer: Self-pay | Admitting: Cardiology

## 2016-11-24 ENCOUNTER — Ambulatory Visit (INDEPENDENT_AMBULATORY_CARE_PROVIDER_SITE_OTHER): Payer: Medicaid Other | Admitting: Cardiology

## 2016-11-24 VITALS — BP 136/96 | HR 106 | Ht 65.0 in | Wt 197.4 lb

## 2016-11-24 DIAGNOSIS — R0789 Other chest pain: Secondary | ICD-10-CM | POA: Diagnosis not present

## 2016-11-24 DIAGNOSIS — R002 Palpitations: Secondary | ICD-10-CM | POA: Diagnosis not present

## 2016-11-24 MED ORDER — DILTIAZEM HCL ER COATED BEADS 120 MG PO CP24: 120.0000 mg | ORAL_CAPSULE | Freq: Every day | ORAL | 3 refills | Status: DC

## 2016-11-24 NOTE — Progress Notes (Signed)
Cardiology Office Note:    Date:  11/24/2016   ID:  Sheila Cooper, DOB 1985/05/09, MRN [id number]  PCP:  Alois Cliche, MD  Cardiologist:  Garwin Brothers, MD   Referring MD: Alois Cliche, MD    ASSESSMENT:    1. Palpitations    PLAN:    In order of problems listed above:  1. I discussed my findings with the patient at extensive length. Echocardiogram report was discussed with the patient and Holter monitor report 2. These are unremarkable. She complains of an elevated heart rate for most part did average heart rate on the Holter monitor was 100. In view of this I have advised her to start Cardizem CD 120 mg daily.I told her to keep herself well hydrated and she vocalized understanding. She had a stress test also since she missed her in the previous appointment. 2. Patient will be seen in follow-up appointment in 1 months or earlier if the patient has any concerns.    Medication Adjustments/Labs and Tests Ordered: Current medicines are reviewed at length with the patient today.  Concerns regarding medicines are outlined above.  No orders of the defined types were placed in this encounter.  No orders of the defined types were placed in this encounter.    Chief Complaint  Patient presents with  . Hospitalization Follow-up    per [facility] ED after recent evaluation for syncope and CP  . Chest Pain  . Dizziness  . Irregular Heart Beat    "fast"     History of Present Illness:    Sheila Cooper is a 31 y.o. female . She has history of palpitations for which she was evaluated. She is here for follow-up. She denies any problems. She tells me that she did not keep her stress test appointment. She tells me that she went to [facility] and was evaluated and released without any testing especially for her heart. At the time of my evaluation is alert awake oriented and in no distress.  Past Medical History:  Diagnosis Date  . Anxiety   . Bipolar 1  disorder (HCC)   . Migraines   . Seizures (HCC)   . TBI (traumatic brain injury) [facility])     Past Surgical History:  Procedure Laterality Date  . NOSE SURGERY    . TUBAL LIGATION      Current Medications: Current Meds  Medication Sig  . EPINEPHrine (EPIPEN 2-PAK) 0.3 mg/0.3 mL IJ SOAJ injection Inject 0.3 mg into the muscle as needed (for allergic reaction).  . Ferrous Sulfate (IRON) 325 (65 Fe) MG TABS Take 1 tablet by mouth daily.   . LamoTRIgine 100 MG TBDP Take 100 mg by mouth daily.   . mirabegron ER (MYRBETRIQ) 50 MG TB24 tablet Take 50 mg by mouth daily.  . Prenatal MV & Min w/FA-DHA (PRENATAL ADULT GUMMY/DHA/FA) 0.4-25 MG CHEW Chew 1 Units by mouth daily.    Current Facility-Administered Medications for the 11/24/16 encounter (Office Visit) with Brandace Cargle, Aundra Dubin, MD  Medication  . metoCLOPramide (REGLAN) 10 mg in dextrose 5 % 50 mL IVPB     Allergies:   Bee venom; Ketorolac; Shellfish allergy; and Meperidine   Social History   Social History  . Marital status: Single    Spouse name: N/A  . Number of children: N/A  . Years of education: N/A   Social History Main Topics  . Smoking status: Never Smoker  . Smokeless tobacco: Never Used  .  Alcohol use No  . Drug use: No  . Sexual activity: Not Asked   Other Topics Concern  . None   Social History Narrative  . None     Family History: The patient's family history includes Congestive Heart Failure in her maternal grandmother and mother.  ROS:   Please see the history of present illness.    All other systems reviewed and are negative.  EKGs/Labs/Other Studies Reviewed:    The following studies were reviewed today: I discussed the reports of the Holter monitor and echocardiogram with the patient at extensive length. Questions were answered to her satisfaction.   Recent Labs: 07/27/2016: ALT 16; BUN 11; Creatinine, Ser 0.85; Hemoglobin 13.9; Platelets 308; Potassium 3.8; Sodium 136 08/11/2016: TSH 0.59    Recent Lipid Panel No results found for: CHOL, TRIG, HDL, CHOLHDL, VLDL, LDLCALC, LDLDIRECT  Physical Exam:    VS:  BP (!) 136/96 (BP Location: Right Arm, Patient Position: Sitting)   Pulse (!) 106   Ht  (1.651 m)   Wt 197 lb 6.4 oz (89.5 kg)   SpO2 98%   BMI 32.85 kg/m     Wt Readings from Last 3 Encounters:  11/24/16 197 lb 6.4 oz (89.5 kg)  10/02/16 203 lb 0.6 oz (92.1 kg)  09/22/16 208 lb (94.3 kg)     GEN: Patient is in no acute distress HEENT: Normal NECK: No JVD; No carotid bruits LYMPHATICS: No lymphadenopathy CARDIAC: Hear sounds regular, 2/6 systolic murmur at the apex. RESPIRATORY:  Clear to auscultation without rales, wheezing or rhonchi  ABDOMEN: Soft, non-tender, non-distended MUSCULOSKELETAL:  No edema; No deformity  SKIN: Warm and dry NEUROLOGIC:  Alert and oriented x 3 PSYCHIATRIC:  Normal affect   Signed, Garwin Brothers, MD  11/24/2016 10:44 AM    [facility]

## 2016-11-24 NOTE — Patient Instructions (Signed)
Medication Instructions:  Your physician has recommended you make the following change in your medication:  START diltiazem (Cardizem) 120 mg daily  Labwork: None  Testing/Procedures: Your physician has requested that you have a stress echocardiogram. For further information please visit [email or web address] Please follow instruction sheet as given.  Follow-Up: Your physician recommends that you schedule a follow-up appointment in: 3 months.  Any Other Special Instructions Will Be Listed Below (If Applicable).     If you need a refill on your cardiac medications before your next appointment, please call your pharmacy.

## 2016-11-24 NOTE — Telephone Encounter (Signed)
P/c with patient she is aware of stress echo appt time, date, and location.cn

## 2016-11-24 NOTE — Telephone Encounter (Signed)
L/m informing patient of stress echo appt at med center 12/27/16 at 1:15.cn

## 2016-12-15 ENCOUNTER — Ambulatory Visit (HOSPITAL_BASED_OUTPATIENT_CLINIC_OR_DEPARTMENT_OTHER): Payer: Medicaid Other | Attending: Neurology | Admitting: Internal Medicine

## 2016-12-15 VITALS — Ht 65.0 in | Wt 186.0 lb

## 2016-12-15 DIAGNOSIS — G4733 Obstructive sleep apnea (adult) (pediatric): Secondary | ICD-10-CM | POA: Diagnosis not present

## 2016-12-15 DIAGNOSIS — R5383 Other fatigue: Secondary | ICD-10-CM | POA: Insufficient documentation

## 2016-12-15 DIAGNOSIS — R0683 Snoring: Secondary | ICD-10-CM | POA: Insufficient documentation

## 2016-12-15 DIAGNOSIS — E669 Obesity, unspecified: Secondary | ICD-10-CM | POA: Insufficient documentation

## 2016-12-15 DIAGNOSIS — Z6831 Body mass index (BMI) 31.0-31.9, adult: Secondary | ICD-10-CM | POA: Diagnosis not present

## 2016-12-16 ENCOUNTER — Encounter (HOSPITAL_COMMUNITY): Payer: Self-pay | Admitting: Emergency Medicine

## 2016-12-16 ENCOUNTER — Emergency Department (HOSPITAL_COMMUNITY)
Admission: EM | Admit: 2016-12-16 | Discharge: 2016-12-16 | Disposition: A | Discharge status: Home / Self Care | Payer: Medicaid Other | Attending: Emergency Medicine | Admitting: Emergency Medicine

## 2016-12-16 DIAGNOSIS — R51 Headache: Secondary | ICD-10-CM | POA: Diagnosis not present

## 2016-12-16 DIAGNOSIS — R519 Headache, unspecified: Secondary | ICD-10-CM

## 2016-12-16 MED ORDER — ONDANSETRON 8 MG PO TBDP: 8.0000 mg | ORAL_TABLET | Freq: Three times a day (TID) | ORAL | 0 refills | Status: DC | PRN

## 2016-12-16 MED ORDER — ONDANSETRON 8 MG PO TBDP
8.0000 mg | ORAL_TABLET | Freq: Once | ORAL | Status: AC
Start: 2016-12-16 — End: 2016-12-16
  Administered 2016-12-16: 8 mg via ORAL
  Filled 2016-12-16: qty 1

## 2016-12-16 MED ORDER — HYDROMORPHONE HCL 1 MG/ML IJ SOLN
1.0000 mg | Freq: Once | INTRAMUSCULAR | Status: AC
  Administered 2016-12-16: 1 mg via INTRAMUSCULAR
  Filled 2016-12-16: qty 1

## 2016-12-16 NOTE — Progress Notes (Signed)
Patient had pseudo Seizure, between 12.07am to 12.15am, Please watch Epoch [id number].

## 2016-12-16 NOTE — ED Provider Notes (Signed)
[facility]-EMERGENCY DEPT Provider Note   CSN: [id number] Arrival date & time: 12/16/16  [id number]     History   Chief Complaint Chief Complaint  Patient presents with  . Migraine  . Fever    HPI Sheila Cooper is a 31 y.o. female.  Patient reports a generalized headache for 1 week.  She has been taking Excedrin with minimal relief.  [facility] and came to the emergency department for evaluation of her headache.  No fever, sweats, chills, meningeal signs.  She has a history of traumatic brain injury, "pseudoseizures", bipolar disorder, migraine headache, anxiety.  Severity of symptoms is moderate.      Past Medical History:  Diagnosis Date  . Anxiety   . Bipolar 1 disorder (HCC)   . Migraines   . Seizures (HCC)   . TBI (traumatic brain injury) [facility](HCC)     Patient Active Problem List   Diagnosis Date Noted  . Chest discomfort 11/24/2016  . Palpitations 10/02/2016  . Memory loss 08/11/2016  . Syncope 08/11/2016  . Recurrent major depressive disorder (HCC) 08/11/2016  . 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 NOSE SURGERY    . TUBAL LIGATION      OB History    No data available       Home Medications    Prior to Admission medications   Medication Sig Start Date End Date Taking? Authorizing Provider  diltiazem (CARDIZEM CD) 120 MG 24 hr capsule Take 1 capsule (120 mg total) by mouth daily. 11/24/16 02/22/17 Yes Revankar, Aundra Dubinajan R, MD  Ferrous Sulfate (IRON) 325 (65 Fe) MG TABS Take 1 tablet by mouth daily.    Yes [provider]  mirabegron ER (MYRBETRIQ) 50 MG TB24 tablet Take 50 mg by mouth daily.   Yes [provider]  Prenatal MV & Min w/FA-DHA (PRENATAL ADULT GUMMY/DHA/FA) 0.4-25 MG CHEW Chew 1 Units by mouth daily.    Yes [provider]  ondansetron (ZOFRAN ODT) 8 MG disintegrating tablet Take 1 tablet (8 mg total)  by mouth every 8 (eight) hours as needed for nausea or vomiting. 12/16/16   Donnetta Hutchingook, Randel Hargens, MD    Family History Family History  Problem Relation Age of Onset  . Congestive Heart Failure Mother   . Congestive Heart Failure Maternal Grandmother     Social History Social History  Substance Use Topics  . Smoking status: Never Smoker  . Smokeless tobacco: Never Used  . Alcohol use No     Allergies   Bee venom; Ketorolac; Pantoprazole; Shellfish allergy; and Meperidine   Review of Systems Review of Systems  All other systems reviewed and are negative.    Physical Exam Updated Vital Signs BP (!) 135/97 (BP Location: Right Arm)   Pulse 86   Temp 98.1 F (36.7 C) (Oral)   Resp 16   Ht 5\' 5"  (1.651 m)   Wt 84.4 kg (186 lb)   LMP 12/02/2016   SpO2 99%   BMI 30.95 kg/m   Physical Exam  Constitutional: She is oriented to person, place, and time. She appears well-developed and well-nourished.  HENT:  Head: Normocephalic and atraumatic.  Eyes: Conjunctivae are normal.  Neck: Neck supple.  No meningeal signs  Cardiovascular: Normal rate and regular rhythm.   Pulmonary/Chest: Effort normal and breath sounds normal.  Abdominal: Soft. Bowel sounds are normal.  Musculoskeletal: Normal range of motion.  Neurological: She is alert and oriented to person, place, and time.  Skin: Skin is warm and dry.  Psychiatric: She has a normal mood and affect. Her behavior is normal.  Nursing note and vitals reviewed.    ED Treatments / Results  Labs (all labs ordered are listed, but only abnormal results are displayed) Labs Reviewed - No data to display  EKG  EKG Interpretation None       Radiology No results found.  Procedures Procedures (including critical care time)  Medications Ordered in ED Medications  HYDROmorphone (DILAUDID) injection 1 mg (1 mg Intramuscular Given 12/16/16 0950)  ondansetron (ZOFRAN-ODT) disintegrating tablet 8 mg (8 mg Oral Given 12/16/16 0949)      Initial Impression / Assessment and Plan / ED Course  I have reviewed the triage vital signs and the nursing notes.  Pertinent labs & imaging results that were available during my care of the patient were reviewed by me and considered in my medical decision making (see chart for details).    Patient is hemodynamically stable.  No evidence of neurological deficits.  I offered her IV fluids, but she preferred an injection.  She feels better after Dilaudid 1 mg IM and Zofran 8 mg ODT.  Discharge medications Zofran 8 mg ODT   Final Clinical Impressions(s) / ED Diagnoses   Final diagnoses:  Intractable headache, unspecified chronicity pattern, unspecified headache type    New Prescriptions Discharge Medication List as of 12/16/2016 10:13 AM    START taking these medications   Details  ondansetron (ZOFRAN ODT) 8 MG disintegrating tablet Take 1 tablet (8 mg total) by mouth every 8 (eight) hours as needed for nausea or vomiting., Starting Sat 12/16/2016, Print         Donnetta Hutching, MD 12/16/16 1036

## 2016-12-16 NOTE — Discharge Instructions (Signed)
Increase fluids. Rest. Medication for nausea. °

## 2016-12-16 NOTE — ED Triage Notes (Signed)
Patient complaining of burning up and has had a migraine all week. Patient has taken Excedrin and she states it is not working. Patient states that she is nauseated and has been vomiting. Patient states that she has chronic migraines. Patient just came from upstairs doing a sleep study.

## 2016-12-26 DIAGNOSIS — R0683 Snoring: Secondary | ICD-10-CM | POA: Diagnosis not present

## 2016-12-26 NOTE — Procedures (Signed)
Patient Name: Sheila Cooper, [name] Date: 12/15/2016 Gender: Female D.O.B: [birth year] Age (years): 31 Referring Provider: Van ClinesKaren M Aquino Height (inches): 65 Interpreting Physician: Jetty Duhamellinton Angeleigh Chiasson MD, ABSM Weight (lbs): 186 RPSGT: Cherylann ParrDubili, Fred BMI: 31 MRN: [id number] Neck Size: 16.00 CLINICAL INFORMATION Sleep Study Type: NPSG  Indication for sleep study: Fatigue, Obesity, Snoring, Witnessed Apneas  Epworth Sleepiness Score: 4  SLEEP STUDY TECHNIQUE As per the AASM Manual for the Scoring of Sleep and Associated Events v2.3 (April 2016) with a hypopnea requiring 4% desaturations.  The channels recorded and monitored were frontal, central and occipital EEG, electrooculogram (EOG), submentalis EMG (chin), nasal and oral airflow, thoracic and abdominal wall motion, anterior tibialis EMG, snore microphone, electrocardiogram, and pulse oximetry.  MEDICATIONS Medications self-administered by patient taken the night of the study : none reported  SLEEP ARCHITECTURE The study was initiated at 10:16:45 PM and ended at 2:48:07 AM.  Sleep onset time was 15.5 minutes and the sleep efficiency was 30.4%. The total sleep time was 82.5 minutes.  Stage REM latency was N/A minutes.  The patient spent 28.48% of the night in stage N1 sleep, 71.52% in stage N2 sleep, 0.00% in stage N3 and 0.00% in REM.  Alpha intrusion was absent.  Supine sleep was 66.67%.  RESPIRATORY PARAMETERS The overall apnea/hypopnea index (AHI) was 5.8 per hour. There were 0 total apneas, including 0 obstructive, 0 central and 0 mixed apneas. There were 8 hypopneas and 0 RERAs.  The AHI during Stage REM sleep was N/A per hour.  AHI while supine was 3.3 per hour.  The mean oxygen saturation was 94.21%. The minimum SpO2 during sleep was 66.00%.  moderate snoring was noted during this study.  CARDIAC DATA The 2 lead EKG demonstrated sinus rhythm. The mean heart rate was 92.35 beats per minute. Other EKG findings  include: None.  LEG MOVEMENT DATA The total PLMS were 0 with a resulting PLMS index of 0.00. Associated arousal with leg movement index was 0.0 .  IMPRESSIONS - Patient recorded only 82 minutes sleep time, no REM. Described as crying at 2:00 AM. Repeated episodes of agitation, turning head side to side,tapping hands, trembling, head and body shaking. Up to bathroom, then episodes would resume. No postictal confusion or incontinence described. She complained of migraine and asked to end recording at 4:02 AM.  - Minimal obstructive sleep apnea occurred during this study (AHI = 5.8/h). - No significant central sleep apnea occurred during this study (CAI = 0.0/h). - Oxygen desaturation was noted during this study (Min O2 = 66.00%, Mean 94.2%). - The patient snored with moderate snoring volume. - No cardiac abnormalities were noted during this study. - Clinically significant periodic limb movements did not occur during sleep. No significant associated arousals.  DIAGNOSIS - Obstructive Sleep Apnea (327.23 [G47.33 ICD-10])  RECOMMENDATIONS - Limited assessment. 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 - Very mild obstructive sleep apnea. If more than conservative intervention is needed, an oral appliance, CPAP or ENT evaluation might be considered, based on clinical judgment. - Sleep hygiene should be reviewed to assess factors that may improve sleep quality. - Weight management and regular exercise should be initiated or continued if appropriate.  [Electronically signed] 12/26/2016 07:42 AM  Jetty Duhamellinton Chriss Redel MD, ABSM Diplomate, American Board of Sleep Medicine   NPI: [id number]

## 2016-12-27 ENCOUNTER — Ambulatory Visit (HOSPITAL_BASED_OUTPATIENT_CLINIC_OR_DEPARTMENT_OTHER): Payer: Medicaid Other

## 2017-01-03 ENCOUNTER — Encounter: Payer: Self-pay | Admitting: Neurology

## 2017-01-03 ENCOUNTER — Ambulatory Visit: Payer: Medicaid Other | Admitting: Neurology

## 2017-01-03 VITALS — BP 128/82 | HR 93 | Ht 65.0 in | Wt 190.0 lb

## 2017-01-03 DIAGNOSIS — G43101 Migraine with aura, not intractable, with status migrainosus: Secondary | ICD-10-CM | POA: Diagnosis not present

## 2017-01-03 DIAGNOSIS — F445 Conversion disorder with seizures or convulsions: Secondary | ICD-10-CM

## 2017-01-03 MED ORDER — NARATRIPTAN HCL 2.5 MG PO TABS: 2.5000 mg | ORAL_TABLET | ORAL | 5 refills | Status: DC | PRN

## 2017-01-03 MED ORDER — DIVALPROEX SODIUM ER 250 MG PO TB24: ORAL_TABLET | ORAL | 6 refills | Status: DC

## 2017-01-03 NOTE — Patient Instructions (Addendum)
1. Start Depakote ER 250mg : take 1 tablet at night for 2 weeks, then increase to 2 tablets at night 2. Take Amerge (naratriptan) 2.5mg : Take 1 tablet at onset of migraine. Do not take more than 2-3 tablets a week. 3. Follow-up with new psychiatrist and pain management 4. Follow-up in 4-5 months, call for any changes
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
## 2017-01-05 ENCOUNTER — Encounter: Payer: Self-pay | Admitting: Neurology

## 2017-01-05 DIAGNOSIS — G43101 Migraine with aura, not intractable, with status migrainosus: Secondary | ICD-10-CM | POA: Insufficient documentation

## 2017-01-08 ENCOUNTER — Telehealth: Payer: Self-pay | Admitting: Neurology

## 2017-01-08 ENCOUNTER — Other Ambulatory Visit: Payer: Self-pay

## 2017-01-08 ENCOUNTER — Telehealth: Payer: Self-pay

## 2017-01-08 DIAGNOSIS — G43101 Migraine with aura, not intractable, with status migrainosus: Secondary | ICD-10-CM

## 2017-01-08 MED ORDER — DIVALPROEX SODIUM ER 250 MG PO TB24: ORAL_TABLET | ORAL | 6 refills | Status: DC

## 2017-01-08 NOTE — Telephone Encounter (Signed)
Re-sent to pt's listed preferred pharmacy

## 2017-01-08 NOTE — Telephone Encounter (Signed)
Received second VM from pt while I was at lunch stating that her pharmacy never received Depakote Rx and that her other medications (Mratriptan) requires prior [place].  Called pharmacy for clarification.  They states that they have the Rx for Depakote, that they have the medication on hand and are "just waiting for pt to pick it up."  Did say that Naratriptan requires prior auth, they will fax to me.    Returned pt call letting her know that pharmacy has her Depakote along with her co-pay ($3).  Pt seemed agitated that she never received message via the [facility] app.  Let her know that I will start prior auth as soon as I receive it.

## 2017-01-08 NOTE — Telephone Encounter (Signed)
Pt called and said the pharmacy did not get the prescription request for depakote

## 2017-01-10 ENCOUNTER — Telehealth: Payer: Self-pay

## 2017-01-10 NOTE — Telephone Encounter (Signed)
Initiated prior authorization.  #[id number]

## 2017-01-11 ENCOUNTER — Telehealth: Payer: Self-pay | Admitting: Neurology

## 2017-01-11 NOTE — Telephone Encounter (Signed)
She can reduce the dose to depakote 125mg  and see if that minimizes side effects.  If she cannot split the tablet in half, please send new rx for depakate 125mg  tab daily.

## 2017-01-11 NOTE — Telephone Encounter (Signed)
Patient states that she is not feeling herself she is sleepy she needs a call ASAP according to her

## 2017-01-11 NOTE — Telephone Encounter (Signed)
Spoke with pt who states that she started taking Depakote 250mg  on Tuesday.  States that she has been shakey, weak, groggy and more tired than normal.  Goes on to state that when she was put on Depakote as a teenager, the doctor did not start her on such a high dose.  Please advise.

## 2017-01-11 NOTE — Telephone Encounter (Signed)
Spoke with pt relaying message below.  She states that she has a pill cutter and will try half tablets for the next day or 2 and see how that goes.

## 2017-02-09 ENCOUNTER — Ambulatory Visit (HOSPITAL_BASED_OUTPATIENT_CLINIC_OR_DEPARTMENT_OTHER): Payer: Medicaid Other

## 2017-02-22 ENCOUNTER — Encounter: Payer: Self-pay | Admitting: Cardiology

## 2017-02-22 ENCOUNTER — Other Ambulatory Visit: Payer: Self-pay

## 2017-02-22 ENCOUNTER — Ambulatory Visit: Payer: Medicaid Other | Admitting: Cardiology

## 2017-02-22 VITALS — BP 116/72 | HR 96 | Ht 65.0 in | Wt 181.0 lb

## 2017-02-22 DIAGNOSIS — R002 Palpitations: Secondary | ICD-10-CM | POA: Diagnosis not present

## 2017-02-22 DIAGNOSIS — R55 Syncope and collapse: Secondary | ICD-10-CM

## 2017-02-22 NOTE — Patient Instructions (Signed)
Medication Instructions:  Your physician recommends that you continue on your current medications as directed. Please refer to the Current Medication list given to you today.  Labwork: None  Testing/Procedures: Your physician has recommended that you wear an event monitor. Event monitors are medical devices that record the heart's electrical activity. Doctors most often us these monitors to diagnose arrhythmias. Arrhythmias are problems with the speed or rhythm of the heartbeat. The monitor is a small, portable device. You can wear one while you do your normal daily activities. This is usually used to diagnose what is causing palpitations/syncope (passing out).  Follow-Up: Your physician recommends that you schedule a follow-up appointment in: 2 months  Any Other Special Instructions Will Be Listed Below (If Applicable).     If you need a refill on your cardiac medications before your next appointment, please call your pharmacy.   [facility]  Garey HamAshley A, RN, BSN   Cardiac Event Monitoring A cardiac event monitor is a small recording device that is used to detect abnormal heart rhythms (arrhythmias). The monitor is used to record your heart rhythm when you have symptoms, such as:  Fast heartbeats (palpitations), such as heart racing or fluttering.  Dizziness.  Fainting or light-headedness.  Unexplained weakness.  Some monitors are wired to electrodes placed on your chest. Electrodes are flat, sticky disks that attach to your skin. Other monitors may be hand-held or worn on the wrist. The monitor can be worn for up to 30 days. If the monitor is attached to your chest, a technician will prepare your chest for the electrode placement and show you how to work the monitor. Take time to practice using the monitor before you leave the office. Make sure you understand how to send the information from the monitor to your health care provider. In some cases, you may need to use a  landline telephone instead of a cell phone. What are the risks? Generally, this device is safe to use, but it possible that the skin under the electrodes will become irritated. How to use your cardiac event monitor  Wear your monitor at all times, except when you are in water: ? Do not let the monitor get wet. ? Take the monitor off when you bathe. Do not swim or use a hot tub with it on.  Keep your skin clean. Do not put body lotion or moisturizer on your chest.  Change the electrodes as told by your health care provider or any time they stop sticking to your skin. You may need to use medical tape to keep them on.  Try to put the electrodes in slightly different places on your chest to help prevent skin irritation. They must remain in the area under your left breast and in the upper right section of your chest.  Make sure the monitor is safely clipped to your clothing or in a location close to your body that your health care provider recommends.  Press the button to record as soon as you feel heart-related symptoms, such as: ? Dizziness. ? Weakness. ? Light-headedness. ? Palpitations. ? Thumping or pounding in your chest. ? Shortness of breath. ? Unexplained weakness.  Keep a diary of your activities, such as walking, doing chores, and taking medicine. It is very important to note what you were doing when you pushed the button to record your symptoms. This will help your health care provider determine what might be contributing to your symptoms.  Send the recorded information as recommended by  your health care provider. It may take some time for your health care provider to process the results.  Change the batteries as told by your health care provider.  Keep electronic devices away from your monitor. This includes: ? Tablets. ? MP3 players. ? Cell phones.  While wearing your monitor you should avoid: ? Electric blankets. ? Firefighter. ? Electric  toothbrushes. ? Microwave ovens. ? Magnets. ? Metal detectors. Get help right away if:  You have chest pain.  You have extreme difficulty breathing or shortness of breath.  You develop a very fast heartbeat that persists.  You develop dizziness that does not go away.  You faint or constantly feel like you are about to faint. Summary  A cardiac event monitor is a small recording device that is used to help detect abnormal heart rhythms (arrhythmias).  The monitor is used to record your heart rhythm when you have heart-related symptoms.  Make sure you understand how to send the information from the monitor to your health care provider.  It is important to press the button on the monitor when you have any heart-related symptoms.  Keep a diary of your activities, such as walking, doing chores, and taking medicine. It is very important to note what you were doing when you pushed the button to record your symptoms. This will help your health care provider learn what might be causing your symptoms. This information is not intended to replace advice given to you by your health care provider. Make sure you discuss any questions you have with your health care provider. Document Released: 11/09/2007 Document Revised: 01/15/2016 Document Reviewed: 01/15/2016 Elsevier Interactive Patient Education  2017 [employer].

## 2017-02-22 NOTE — Progress Notes (Signed)
Cardiology Office Note:    Date:  02/22/2017   ID:  Sheila HaJamie L Hennes, DOB 1985-12-24, MRN [id number]  PCP:  Alois ClicheBazel, Sohail N, MD  Cardiologist:  Garwin Brothersajan R Mckennah Kretchmer, MD   Referring MD: Alois ClicheBazel, Sohail N, MD    ASSESSMENT:    1. Syncope, unspecified syncope type   2. Palpitations    PLAN:    In order of problems listed above:  1. I discussed my findings with the patient at extensive length.  Her blood pressure is stable her echocardiogram and Holter monitoring was unremarkable.  She is going to get her a stress test done in the next few days.  Also in view of reported passing out spells we will do a 30-day event monitor.  She will be seen in follow-up appointment in 2 months or earlier if she has any concerns.  She knows to go to the nearest emergency room for any concerning symptoms.  I have asked her to keep close follow-up with the primary care physician and neurologist.  I suspect some of this symptoms could come from a neuro standpoint and that will need to be assessed very closely.   Medication Adjustments/Labs and Tests Ordered: Current medicines are reviewed at length with the patient today.  Concerns regarding medicines are outlined above.  Orders Placed This Encounter  Procedures  . Cardiac event monitor  . EKG 12-Lead   No orders of the defined types were placed in this encounter.    Chief Complaint  Patient presents with  . Loss of Consciousness    x3 syncope episodes on  Dec. 25th afternoon-night, went to ED     History of Present Illness:    Sheila Cooper is a 32 y.o. female.  She is accompanied by her husband.  She mentions to me that she has had repeated syncopal events.  No chest pain orthopnea or PND.  Her husband mentions to me that when he does horseplay with her she can have a passing out spell.  No orthopnea or PND.  At the time of my evaluation, the patient is alert awake oriented and in no distress.  She says that she has never hurt herself passing out.  She also  many at times knows that she is going to have a passing out spell she has been assessed by her primary care physician and neurologist for this.  Past Medical History:  Diagnosis Date  . Anxiety   . Bipolar 1 disorder (HCC)   . Migraines   . Seizures (HCC)   . TBI (traumatic brain injury) [facility](HCC)     Past Surgical History:  Procedure Laterality Date  . NOSE SURGERY    . TUBAL LIGATION      Current Medications: Current Meds  Medication Sig  . diltiazem (CARDIZEM CD) 120 MG 24 hr capsule Take 1 capsule (120 mg total) by mouth daily.  . Ferrous Sulfate (IRON) 325 (65 Fe) MG TABS Take 1 tablet by mouth daily.   Marland Kitchen. FLUoxetine (PROZAC) 10 MG capsule Take 1 capsule by mouth daily.  Marland Kitchen. ibuprofen (ADVIL,MOTRIN) 800 MG tablet Take 800 mg by mouth every 8 (eight) hours as needed.  . pantoprazole (PROTONIX) 40 MG tablet Take 40 mg by mouth daily.  . Prenatal MV & Min w/FA-DHA (PRENATAL ADULT GUMMY/DHA/FA) 0.4-25 MG CHEW Chew 1 Units by mouth daily.    Current Facility-Administered Medications for the 02/22/17 encounter (Office Visit) with Aleicia Kenagy, Aundra Dubinajan R, MD  Medication  . metoCLOPramide (REGLAN) 10 mg in  dextrose 5 % 50 mL IVPB     Allergies:   Bee venom; Ketorolac; Pantoprazole; and Meperidine   Social History   Socioeconomic History  . Marital status: Single    Spouse name: None  . Number of children: None  . Years of education: None  . Highest education level: None  Social Needs  . Financial resource strain: None  . Food insecurity - worry: None  . Food insecurity - inability: None  . Transportation needs - medical: None  . Transportation needs - non-medical: None  Occupational History  . None  Tobacco Use  . Smoking status: Never Smoker  . Smokeless tobacco: Never Used  Substance and Sexual Activity  . Alcohol use: No  . Drug use: No  . Sexual activity: None  Other Topics Concern  . None  Social History Narrative  . None     Family History: The patient's family  history includes Congestive Heart Failure in her maternal grandmother and mother.  ROS:   Please see the history of present illness.    All other systems reviewed and are negative.  EKGs/Labs/Other Studies Reviewed:    The following studies were reviewed today: EKG done today reveals sinus rhythm with nonspecific ST-T changes   Recent Labs: 07/27/2016: ALT 16; BUN 11; Creatinine, Ser 0.85; Hemoglobin 13.9; Platelets 308; Potassium 3.8; Sodium 136 08/11/2016: TSH 0.59  Recent Lipid Panel No results found for: CHOL, TRIG, HDL, CHOLHDL, VLDL, LDLCALC, LDLDIRECT  Physical Exam:    VS:  BP 116/72 (BP Location: Right Arm, Patient Position: Sitting, Cuff Size: Large)   Pulse 96   Ht 5\' 5"  (1.651 m)   Wt 181 lb (82.1 kg)   SpO2 98%   BMI 30.12 kg/m     Wt Readings from Last 3 Encounters:  02/22/17 181 lb (82.1 kg)  01/03/17 190 lb (86.2 kg)  12/16/16 186 lb (84.4 kg)     GEN: Patient is in no acute distress HEENT: Normal NECK: No JVD; No carotid bruits LYMPHATICS: No lymphadenopathy CARDIAC: Hear sounds regular, 2/6 systolic murmur at the apex. RESPIRATORY:  Clear to auscultation without rales, wheezing or rhonchi  ABDOMEN: Soft, non-tender, non-distended MUSCULOSKELETAL:  No edema; No deformity  SKIN: Warm and dry NEUROLOGIC:  Alert and oriented x 3 PSYCHIATRIC:  Normal affect   Signed, Garwin Brothers, MD  02/22/2017 10:04 AM    [facility]

## 2017-02-27 ENCOUNTER — Ambulatory Visit: Payer: Medicaid Other

## 2017-03-01 ENCOUNTER — Ambulatory Visit (HOSPITAL_BASED_OUTPATIENT_CLINIC_OR_DEPARTMENT_OTHER)
Admission: RE | Admit: 2017-03-01 | Discharge: 2017-03-01 | Disposition: A | Discharge status: Home / Self Care | Payer: Medicaid Other | Source: Ambulatory Visit | Attending: Cardiology | Admitting: Cardiology

## 2017-03-01 DIAGNOSIS — R0789 Other chest pain: Secondary | ICD-10-CM | POA: Diagnosis not present

## 2017-03-01 DIAGNOSIS — R002 Palpitations: Secondary | ICD-10-CM | POA: Diagnosis not present

## 2017-03-01 NOTE — Progress Notes (Signed)
Echocardiogram Echocardiogram Stress Test has been performed.  Dorothey BasemanReel, Mikenzi Raysor M 03/01/2017, 12:56 PM

## 2017-03-15 ENCOUNTER — Encounter: Payer: Self-pay | Admitting: Cardiology

## 2017-03-15 ENCOUNTER — Ambulatory Visit: Payer: Medicaid Other | Admitting: Cardiology

## 2017-03-15 VITALS — BP 114/70 | HR 99 | Ht 65.0 in | Wt 173.0 lb

## 2017-03-15 DIAGNOSIS — R002 Palpitations: Secondary | ICD-10-CM | POA: Diagnosis not present

## 2017-03-15 DIAGNOSIS — R55 Syncope and collapse: Secondary | ICD-10-CM | POA: Diagnosis not present

## 2017-03-15 NOTE — Progress Notes (Signed)
Electrophysiology Office Note   Date:  03/15/2017   ID:  Sheila Cooper, DOB 06-21-1985, MRN [id number]  PCP:  Sheila JabsBrown, Emily, PA-C  Cardiologist:  Revankar Primary Electrophysiologist:  Burna Atlas Jorja LoaMartin Adelheid Hoggard, MD    Chief Complaint  Patient presents with  . Advice Only    Syncope/Palpitations     History of Present Illness: Sheila Cooper is a 32 y.o. female who is being seen today for the evaluation of syncope at the request of Alois ClicheBazel, Sohail N, MD. Presenting today for electrophysiology evaluation.  He has a past history of bipolar 1, seizures, traumatic brain injury, and anxiety.  She has had repeated syncopal episodes.  She has had no chest pain orthopnea or PND.  She has not had any injuries due to her syncopal episodes.  Her most recent episode of syncope occurred on Christmas day.  She had a few episodes within the months leading up to Christmas.  She had 3 episodes on Christmas day.  The episodes were associated with palpitations.  She is also had palpitations of multiple other times such as going to a physician's office and being anxious.  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  She also has anxiety.  She was recently put on Prozac which has helped with her palpitations.  She has not had any since starting the Prozac.  She is also not had an episode of syncope since starting the Prozac.    Today, she denies symptoms of palpitations, chest pain, shortness of breath, orthopnea, PND, lower extremity edema, claudication, bleeding, or neurologic sequela. The patient is tolerating medications without difficulties.    Past Medical History:  Diagnosis Date  . Anxiety   . Bipolar 1 disorder (HCC)   . Migraines   . Seizures (HCC)   . TBI (traumatic brain injury) [facility](HCC)    Past Surgical History:  Procedure Laterality Date  . NOSE SURGERY    . TUBAL LIGATION       Current Outpatient Medications  Medication Sig Dispense Refill  . diltiazem (CARDIZEM CD) 120 MG 24 hr capsule Take 1  capsule (120 mg total) by mouth daily. 90 capsule 3  . Ferrous Sulfate (IRON) 325 (65 Fe) MG TABS Take 1 tablet by mouth daily.     Marland Kitchen. FLUoxetine (PROZAC) 10 MG capsule Take 1 capsule by mouth daily.  1  . ibuprofen (ADVIL,MOTRIN) 800 MG tablet Take 800 mg by mouth every 8 (eight) hours as needed.    . pantoprazole (PROTONIX) 40 MG tablet Take 40 mg by mouth daily.    . Prenatal MV & Min w/FA-DHA (PRENATAL ADULT GUMMY/DHA/FA) 0.4-25 MG CHEW Chew 1 Units by mouth daily.      Current Facility-Administered Medications  Medication Dose Route Frequency Provider Last Rate Last Dose  . metoCLOPramide (REGLAN) 10 mg in dextrose 5 % 50 mL IVPB  10 mg Intramuscular Once Van ClinesAquino, Karen M, MD        Allergies:   Bee venom; Ketorolac; Pantoprazole; Shellfish allergy; and Meperidine   Social History:  The patient  reports that  has never smoked. she has never used smokeless tobacco. She reports that she does not drink alcohol or use drugs.   Family History:  The patient's family history includes Congestive Heart Failure in her maternal grandmother and mother.    ROS:  Please see the history of present illness.   Otherwise, review of systems is positive for appetite change, fatigue, cough, nausea, depression, back pain, dizziness, passing out, headaches.  All other systems are reviewed and negative.    PHYSICAL EXAM: VS:  BP 114/70   Pulse 99   Ht 5\' 5"  (1.651 m)   Wt 173 lb (78.5 kg)   BMI 28.79 kg/m  , BMI Body mass index is 28.79 kg/m. GEN: Well nourished, well developed, in no acute distress  HEENT: normal  Neck: no JVD, carotid bruits, or masses Cardiac: RRR; no murmurs, rubs, or gallops,no edema  Respiratory:  clear to auscultation bilaterally, normal work of breathing GI: soft, nontender, nondistended, + BS MS: no deformity or atrophy  Skin: warm and dry Neuro:  Strength and sensation are intact Psych: euthymic mood, full affect  EKG:  EKG is ordered today. Personal review of the ekg  ordered 11/13/17 shows sinus rhythm, rate 97, nonspecific T wave changes  Recent Labs: 07/27/2016: ALT 16; BUN 11; Creatinine, Ser 0.85; Hemoglobin 13.9; Platelets 308; Potassium 3.8; Sodium 136 08/11/2016: TSH 0.59    Lipid Panel  No results found for: CHOL, TRIG, HDL, CHOLHDL, VLDL, LDLCALC, LDLDIRECT   Wt Readings from Last 3 Encounters:  03/15/17 173 lb (78.5 kg)  02/22/17 181 lb (82.1 kg)  01/03/17 190 lb (86.2 kg)      Other studies Reviewed: Additional studies/ records that were reviewed today include: Stress echo 03/01/16  Review of the above records today demonstrates:  - Stress: Functional capacity was normal. - Stress ECG conclusions: There were no stress arrhythmias or   conduction abnormalities. The stress ECG was non-diagnostic. - Staged echo: Normal echo stress  Impressions:  - Normal study after maximal exercise.  Holter 10/17/16 - personally reviewed Baseline rhythm: sinus  Minimum heart rate: 59BPM.  Average heart rate: 101BPM.  Maximal heart rate 166BPM.  Atrial arrhythmia: rare PAC's  Ventricular arrhythmia: one PVC  Conduction abnormality: none  Symptoms: none   Conclusion:  Holter monitoring was largely unremarkable. Patient was asymptomatic. Average heart rate was elevated. Findings of the test discussed with the patient at appointment today.  ASSESSMENT AND PLAN:  1.  Syncope: Stress echo and Holter monitor unremarkable.  Per the patient, she cannot wear a 30-day monitor due to issues with the patches.  I discussed with her the possibility of link monitor implantation.  Due to the fact that she is felt much improved since starting the Prozac with less anxiety, it is possible that her episodes of syncope were anxiety driven.  It is also possible that her palpitations are anxiety driven.  Due to that, we [name] hold off on link monitor implantation.  Should she have further episodes of syncope, would plan for implantation at that time.  Of  note, no driving per [facility] law for 6 months.    Current medicines are reviewed at length with the patient today.   The patient does not have concerns regarding her medicines.  The following changes were made today:  none  Labs/ tests ordered today include:  No orders of the defined types were placed in this encounter.  Discussed with primary cardiology  Disposition:   FU with Jerami Tammen PRN  Signed, Lety Cullens Jorja Loa, MD  03/15/2017 2:32 PM     [facility] [address] [place] [address] [phone] (office) [phone] (fax)

## 2017-03-15 NOTE — Patient Instructions (Signed)
Medication Instructions:  Your physician recommends that you continue on your current medications as directed. Please refer to the Current Medication list given to you today.  * If you need a refill on your cardiac medications before your next appointment, please call your pharmacy.   Labwork: None ordered  Testing/Procedures: None ordered  Follow-Up: No follow up is needed at this time with Dr. Camnitz.  He will see you on an as needed basis.   Thank you for choosing [facility]!!   Drew Herman, RN [phone]     

## 2017-04-23 ENCOUNTER — Ambulatory Visit: Payer: Medicaid Other | Admitting: Cardiology

## 2017-07-06 ENCOUNTER — Ambulatory Visit: Payer: Medicaid Other | Admitting: Neurology

## 2019-04-12 IMAGING — MR MR HEAD WO/W CM
11 series · 48 of 48 positions shown · IV contrast (multihance)
Comparison: CT HEAD March 02, 2016

CLINICAL DATA: Progressive dizziness and weakness, syncope for 4
years. Psychogenic nonepileptic seizure.

EXAM:
MRI HEAD WITHOUT AND WITH CONTRAST
TECHNIQUE: Multiplanar, multiecho pulse sequences of the brain and surrounding
structures were obtained without and with intravenous contrast.
CONTRAST:  20mL MULTIHANCE GADOBENATE DIMEGLUMINE 529 MG/ML IV SOLN

[Series 5: T1 · sagittal · 4.0mm · 0.75mm/px · 3 of 27 slices shown (1 of 3)]
[im 1/27]
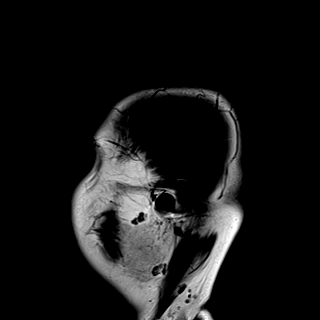
[im 14/27]
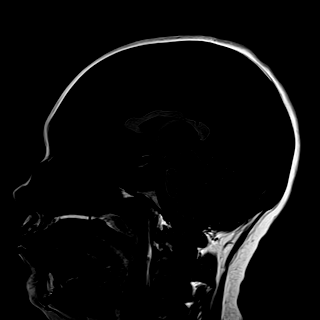
[im 27/27]
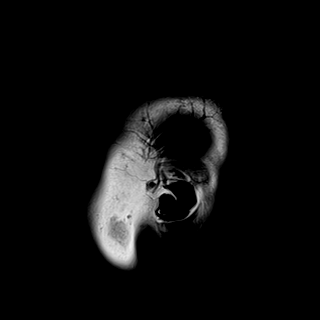

[Series 6: T2 · axial · 4.0mm · 0.36mm/px · z∈[-66,+64]mm · 2 of 26 slices shown (1 of 2)]
[im 1/26]
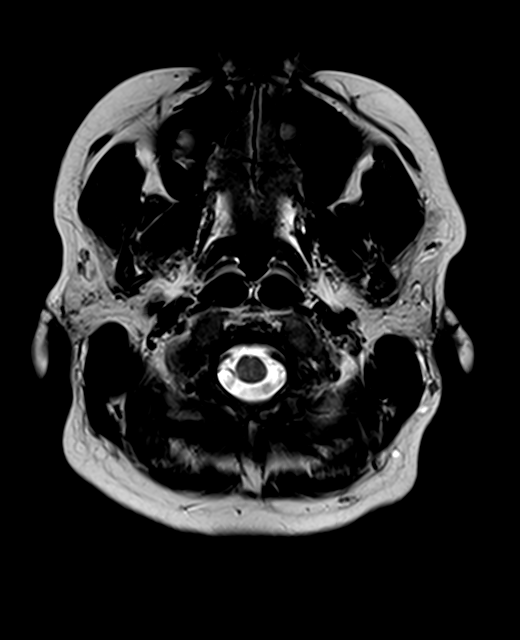
[im 26/26]
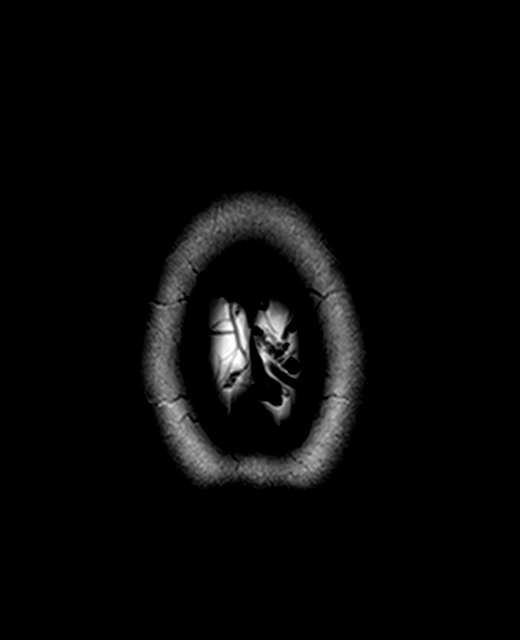

[Series 7: DWI · axial · 3.0mm · 1.44mm/px · z∈[-66,+62]mm · 6 of 80 slices shown (1 of 2)]
[im 1/80]
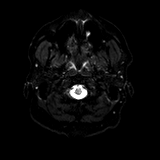
[im 16/80]
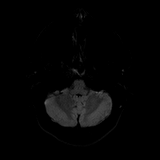
[im 32/80]
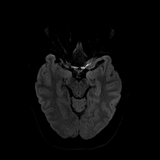
[im 48/80]
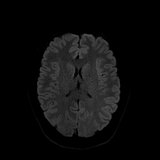
[im 64/80]
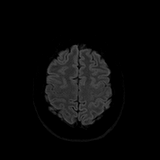
[im 80/80]
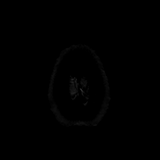

[Series 8: DWI · axial · 3.0mm · 1.44mm/px · z∈[-66,+62]mm · 3 of 40 slices shown (2 of 2)]
[im 1/40]
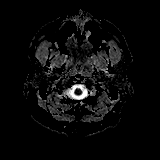
[im 20/40]
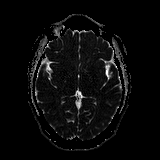
[im 40/40]
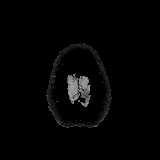

[Series 10: swi_images · axial · 2.0mm · 0.90mm/px · z∈[-73,+68]mm · 5 of 72 slices shown]
[im 1/72]
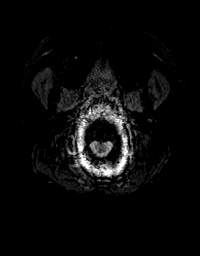
[im 18/72]
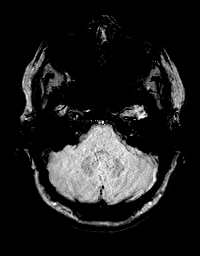
[im 36/72]
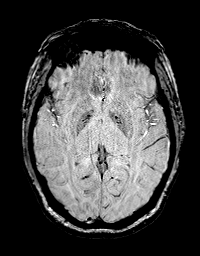
[im 54/72]
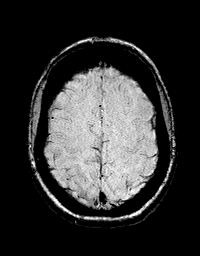
[im 72/72]
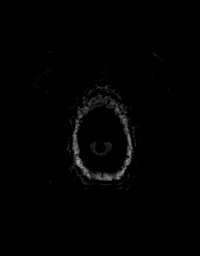

[Series 11: FLAIR · axial · 3.0mm · 0.72mm/px · z∈[-71,+64]mm · 3 of 46 slices shown]
[im 1/46]
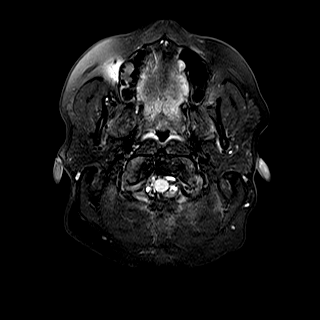
[im 23/46]
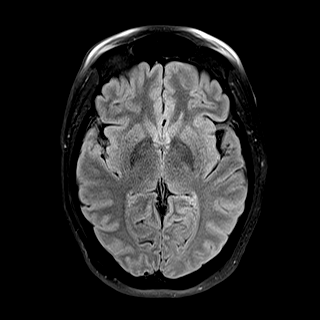
[im 46/46]
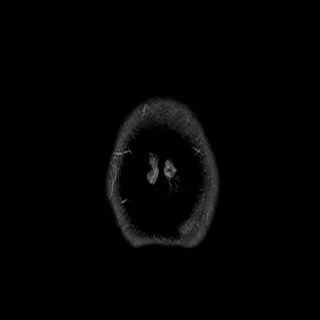

[Series 12: T1 · axial · 1.0mm · 0.90mm/px · z∈[-74,+69]mm · 10 of 144 slices shown (2 of 3)]
[im 1/144]
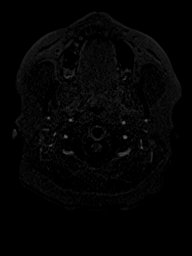
[im 16/144]
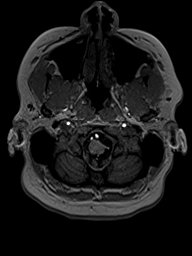
[im 32/144]
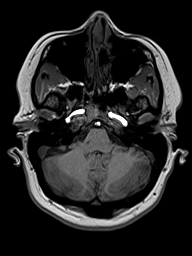
[im 48/144]
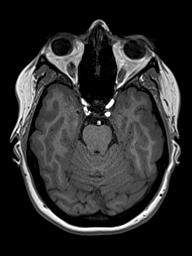
[im 64/144]
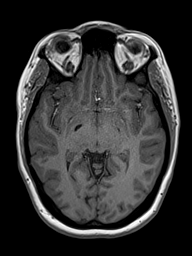
[im 80/144]
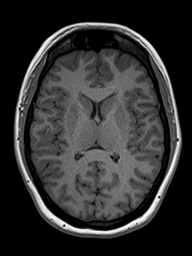
[im 96/144]
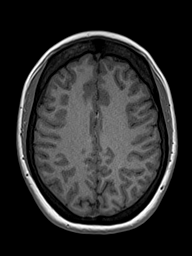
[im 112/144]
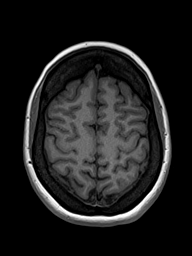
[im 128/144]
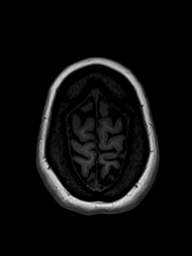
[im 144/144]
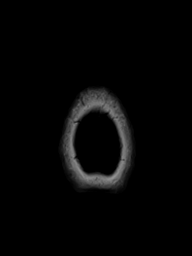

[Series 13: T2 · coronal · 3.0mm · 0.47mm/px · 2 of 24 slices shown (2 of 2)]
[im 1/24]
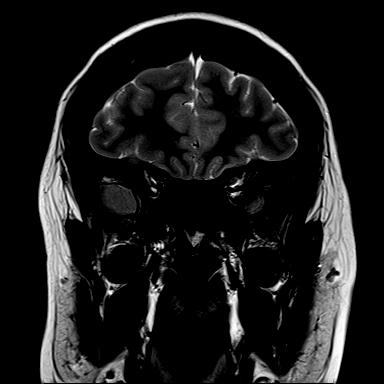
[im 24/24]
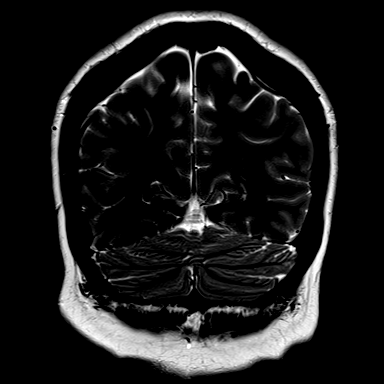

[Series 14: T2 post-contrast · coronal · 4.0mm · 0.36mm/px · 2 of 30 slices shown]
[im 1/30]
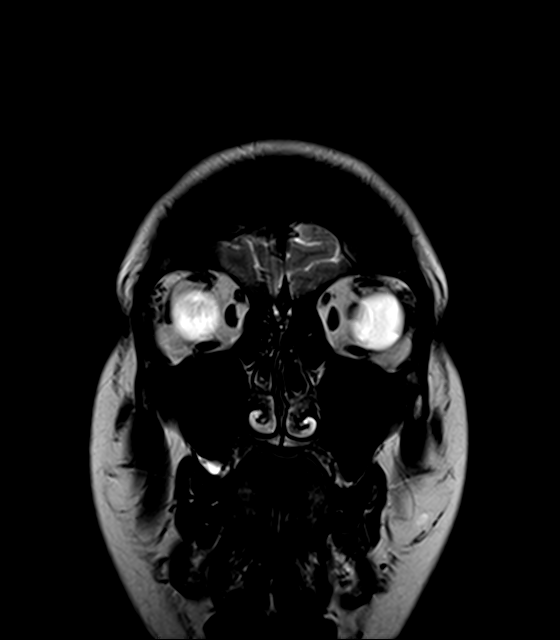
[im 30/30]
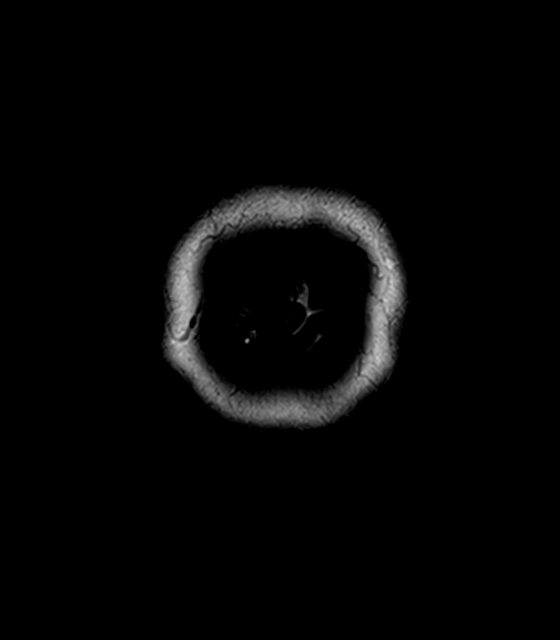

[Series 15: T1 · axial · 1.0mm · 0.90mm/px · z∈[-74,+69]mm · 10 of 144 slices shown (3 of 3)]
[im 1/144]
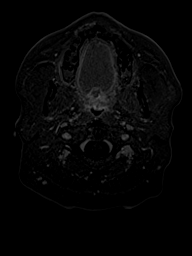
[im 16/144]
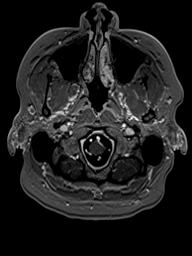
[im 32/144]
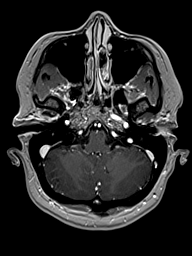
[im 48/144]
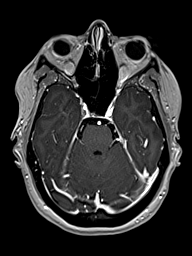
[im 64/144]
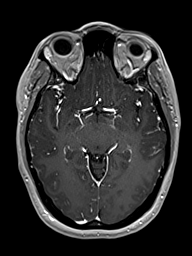
[im 80/144]
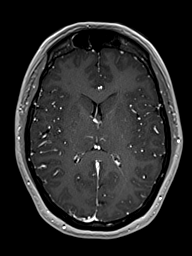
[im 96/144]
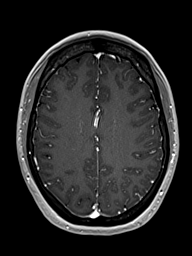
[im 112/144]
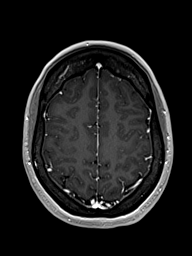
[im 128/144]
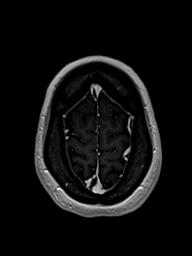
[im 144/144]
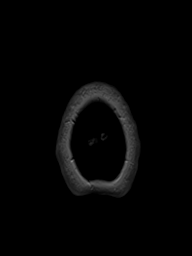

[Series 16: T1 post-contrast · coronal · 4.0mm · 0.72mm/px · 2 of 30 slices shown]
[im 1/30]
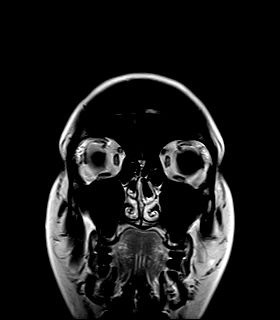
[im 30/30]
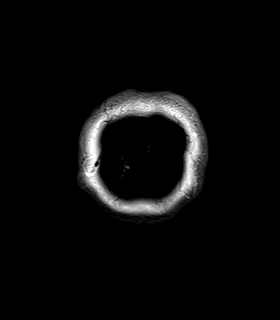

[48 of 48 positions shown; findings below may reference images not displayed]

FINDINGS: INTRACRANIAL CONTENTS: No reduced diffusion to suggest acute
ischemia. No susceptibility artifact to suggest hemorrhage. The
ventricles and sulci are normal for patient's age. No suspicious
parenchymal signal, masses, mass effect. No abnormal
intraparenchymal or extra-axial enhancement. No abnormal extra-axial
fluid collections. No extra-axial masses. Normal symmetric size,
morphology and signal of the hippocampi.

VASCULAR: Normal major intracranial vascular flow voids present at
skull base.

SKULL AND UPPER CERVICAL SPINE: No abnormal sellar expansion. No
suspicious calvarial bone marrow signal. Craniocervical junction
maintained.

SINUSES/ORBITS: Small mucosal retention cysts in the paranasal
sinuses. Mastoid air cells are well aerated. The included ocular
globes and orbital contents are non-suspicious.

OTHER: None.
IMPRESSION: Normal MRI of the head with and without contrast.

## 2020-03-24 ENCOUNTER — Other Ambulatory Visit: Payer: Self-pay

## 2020-03-24 ENCOUNTER — Emergency Department (HOSPITAL_COMMUNITY)
Admission: EM | Admit: 2020-03-24 | Discharge: 2020-03-24 | Disposition: A | Discharge status: Home / Self Care | Payer: Medicaid Other | Attending: Emergency Medicine | Admitting: Emergency Medicine

## 2020-03-24 ENCOUNTER — Encounter (HOSPITAL_COMMUNITY): Payer: Self-pay | Admitting: Emergency Medicine

## 2020-03-24 ENCOUNTER — Emergency Department (HOSPITAL_COMMUNITY): Payer: Medicaid Other

## 2020-03-24 DIAGNOSIS — R410 Disorientation, unspecified: Secondary | ICD-10-CM | POA: Insufficient documentation

## 2020-03-24 DIAGNOSIS — Z5321 Procedure and treatment not carried out due to patient leaving prior to being seen by health care provider: Secondary | ICD-10-CM | POA: Insufficient documentation

## 2020-03-24 LAB — CBC
HCT: 41.7 % (ref 36.0–46.0)
Hemoglobin: 13.8 g/dL (ref 12.0–15.0)
MCH: 28.6 pg (ref 26.0–34.0)
MCHC: 33.1 g/dL (ref 30.0–36.0)
MCV: 86.5 fL (ref 80.0–100.0)
Platelets: 236 10*3/uL (ref 150–400)
RBC: 4.82 MIL/uL (ref 3.87–5.11)
RDW: 12.5 % (ref 11.5–15.5)
WBC: 9.3 10*3/uL (ref 4.0–10.5)
nRBC: 0 % (ref 0.0–0.2)

## 2020-03-24 LAB — DIFFERENTIAL
Abs Immature Granulocytes: 0.03 10*3/uL (ref 0.00–0.07)
Basophils Absolute: 0 10*3/uL (ref 0.0–0.1)
Basophils Relative: 0 %
Eosinophils Absolute: 0.1 10*3/uL (ref 0.0–0.5)
Eosinophils Relative: 1 %
Immature Granulocytes: 0 %
Lymphocytes Relative: 27 %
Lymphs Abs: 2.5 10*3/uL (ref 0.7–4.0)
Monocytes Absolute: 0.4 10*3/uL (ref 0.1–1.0)
Monocytes Relative: 5 %
Neutro Abs: 6.2 10*3/uL (ref 1.7–7.7)
Neutrophils Relative %: 67 %

## 2020-03-24 LAB — PROTIME-INR
INR: 1.1 (ref 0.8–1.2)
Prothrombin Time: 13.3 seconds (ref 11.4–15.2)

## 2020-03-24 LAB — COMPREHENSIVE METABOLIC PANEL
ALT: 27 U/L (ref 0–44)
AST: 26 U/L (ref 15–41)
Albumin: 4.3 g/dL (ref 3.5–5.0)
Alkaline Phosphatase: 85 U/L (ref 38–126)
Anion gap: 11 (ref 5–15)
BUN: 13 mg/dL (ref 6–20)
CO2: 24 mmol/L (ref 22–32)
Calcium: 9.2 mg/dL (ref 8.9–10.3)
Chloride: 107 mmol/L (ref 98–111)
Creatinine, Ser: 0.83 mg/dL (ref 0.44–1.00)
GFR, Estimated: >60 mL/min (ref >60)
Glucose, Bld: 93 mg/dL (ref 70–99)
Potassium: 3.4 mmol/L — ABNORMAL LOW (ref 3.5–5.1)
Sodium: 142 mmol/L (ref 135–145)
Total Bilirubin: 0.5 mg/dL (ref 0.3–1.2)
Total Protein: 7.8 g/dL (ref 6.5–8.1)

## 2020-03-24 LAB — I-STAT CHEM 8, ED
BUN: 15 mg/dL (ref 6–20)
Calcium, Ion: 1.17 mmol/L (ref 1.15–1.40)
Chloride: 108 mmol/L (ref 98–111)
Creatinine, Ser: 0.7 mg/dL (ref 0.44–1.00)
Glucose, Bld: 88 mg/dL (ref 70–99)
HCT: 44 % (ref 36.0–46.0)
Hemoglobin: 15 g/dL (ref 12.0–15.0)
Potassium: 3.5 mmol/L (ref 3.5–5.1)
Sodium: 143 mmol/L (ref 135–145)
TCO2: 22 mmol/L (ref 22–32)

## 2020-03-24 LAB — I-STAT BETA HCG BLOOD, ED (MC, WL, AP ONLY): I-stat hCG, quantitative: <5 m[IU]/mL (ref <5)

## 2020-03-24 LAB — APTT: aPTT: 29 seconds (ref 24–36)

## 2020-03-24 MED ORDER — SODIUM CHLORIDE 0.9% FLUSH: 3.0000 mL | Freq: Once | INTRAVENOUS | Status: DC

## 2020-03-24 NOTE — ED Triage Notes (Signed)
Patient here for intermittent confusion that started Sunday. Patient was admitted to a hospital in [place] earlier in the week for same. Patient is slow to respond but answers questions appropriately. Patient and family state they were at the patient's neurologist this morning and were told the patient needed and MRI.

## 2020-03-24 NOTE — ED Notes (Signed)
Called pt name x3 for VS recheck. No response from pt.  

## 2022-03-29 ENCOUNTER — Encounter (HOSPITAL_BASED_OUTPATIENT_CLINIC_OR_DEPARTMENT_OTHER): Payer: Self-pay | Admitting: Orthopedic Surgery

## 2022-03-29 DIAGNOSIS — R569 Unspecified convulsions: Secondary | ICD-10-CM

## 2022-03-29 HISTORY — DX: Unspecified convulsions: R56.9

## 2022-03-29 NOTE — Progress Notes (Signed)
Pre op call attempted with pt. She reports being compliant with her medications but still having active seizures, last seizure being this morning. LVM with Kelli at Dr Greta Doom office letting her know that the pt does not meet our outpt surgery center parameters and that the case will need to be moved to the main OR.

## 2022-04-04 ENCOUNTER — Encounter (HOSPITAL_BASED_OUTPATIENT_CLINIC_OR_DEPARTMENT_OTHER): Payer: Self-pay

## 2022-04-04 ENCOUNTER — Ambulatory Visit (HOSPITAL_BASED_OUTPATIENT_CLINIC_OR_DEPARTMENT_OTHER): Admit: 2022-04-04 | Payer: Medicaid Other | Admitting: Orthopedic Surgery

## 2022-04-04 SURGERY — RELEASE, FIRST DORSAL COMPARTMENT, HAND
Anesthesia: Monitor Anesthesia Care | Laterality: Right

## 2022-04-08 NOTE — H&P (Signed)
Preoperative History & Physical Exam  Surgeon: Matt Holmes, MD  Diagnosis: Right De Quervains tenosynovitis  Planned Procedure: Procedure(s) (LRB): Right Bluford Kaufmann release (Right)  History of Present Illness:   Patient is a 37 y.o. female with symptoms consistent with Right De Quervains tenosynovitis who presents for surgical intervention. The risks, benefits and alternatives of surgical intervention were discussed and informed consent was obtained prior to surgery.  Past Medical History:  Past Medical History:  Diagnosis Date   Anxiety    Bipolar 1 disorder ([place])    Migraines    Seizures ([place]) 03/29/2022   pt report last seizure 'today'   TBI (traumatic brain injury) [facility])     Past Surgical History:  Past Surgical History:  Procedure Laterality Date   NOSE SURGERY     TUBAL LIGATION      Medications:  Prior to Admission medications   Medication Sig Start Date End Date Taking? Authorizing Provider  albuterol (VENTOLIN HFA) 108 (90 Base) MCG/ACT inhaler Inhale 2 puffs into the lungs every 6 (six) hours as needed for wheezing or shortness of breath.   Yes [provider]  cyclobenzaprine (FLEXERIL) 10 MG tablet Take 10 mg by mouth 3 (three) times daily as needed for muscle spasms.   Yes [provider]  eletriptan (RELPAX) 40 MG tablet Take 40 mg by mouth as needed for migraine or headache. May repeat in 2 hours if headache persists or recurs.   Yes [provider]  famotidine (PEPCID) 40 MG tablet Take 40 mg by mouth at bedtime. 01/30/22  Yes [provider]  Ferrous Fumarate (IRON) 18 MG TBCR Take 36 mg by mouth daily.   Yes [provider]  HYDROcodone-acetaminophen (NORCO/VICODIN) 5-325 MG tablet Take 1 tablet by mouth every 6 (six) hours. 03/30/22  Yes [provider]  hydrOXYzine (ATARAX) 10 MG tablet Take 10-20 mg by mouth every 4 (four) hours as needed for anxiety.   Yes [provider]  ibuprofen  (ADVIL,MOTRIN) 800 MG tablet Take 800 mg by mouth every 8 (eight) hours as needed.   Yes [provider]  LINZESS 145 MCG CAPS capsule Take 145 mcg by mouth every morning. 03/23/22  Yes [provider]  meclizine (ANTIVERT) 12.5 MG tablet Take 12.5 mg by mouth 2 (two) times daily as needed for dizziness.   Yes [provider]  metFORMIN (GLUCOPHAGE) 1000 MG tablet Take 1,000 mg by mouth 2 (two) times daily with a meal.   Yes [provider]  metoprolol succinate (TOPROL-XL) 25 MG 24 hr tablet Take 25 mg by mouth daily.   Yes [provider]  omeprazole (PRILOSEC) 40 MG capsule Take 40 mg by mouth daily. 03/15/22  Yes [provider]  OZEMPIC, 0.25 OR 0.5 MG/DOSE, 2 MG/3ML SOPN Inject 0.5 mg into the skin every Wednesday. 01/29/22  Yes [provider]  verapamil (CALAN) 120 MG tablet Take 120 mg by mouth daily. 03/08/22  Yes [provider]  Vitamin D, Ergocalciferol, (DRISDOL) 1.25 MG (50000 UNIT) CAPS capsule Take 50,000 Units by mouth every Friday.   Yes [provider]  VYEPTI 100 MG/ML injection Inject 100 mg into the vein every 3 (three) months. 01/13/22  Yes [provider]  levocetirizine (XYZAL) 5 MG tablet Take 5 mg by mouth daily. 03/31/22   [provider]    Allergies:  Bee venom, Cariprazine hcl, Metoclopramide, Ketorolac, Lybalvi [olanzapine-samidorphan], Pantoprazole, Meperidine, and Methylphenidate hcl  Review of Systems: Negative except per HPI.  Physical Exam: Alert and oriented, NAD Head and neck: no masses, normal alignment CV: pulse intact Pulm: no increased work of breathing, respirations even and unlabored Abdomen: non-distended Extremities: extremities warm and well perfused  LABS: No results found for this or any previous visit (from the past 2160 hour(s)).   Complete History and Physical exam available in the office notes  Sheila Cooper

## 2022-04-10 ENCOUNTER — Other Ambulatory Visit: Payer: Self-pay

## 2022-04-10 ENCOUNTER — Encounter (HOSPITAL_COMMUNITY): Payer: Self-pay | Admitting: Orthopedic Surgery

## 2022-04-10 NOTE — Progress Notes (Signed)
Anesthesia Chart Review:  37 year old female with pertinent history includes bipolar 1 disorder, PTSD, anxiety disorder, psychogenic nonepileptic pseudo seizures, GERD, hiatal hernia, reportedly mild OSA not on CPAP.  Per prior neurology records, patient has history of psychogenic nonepileptic events diagnosed by EMU at [facility] in 2014.  Patient also had prior cardiology workup in 2018 for questionable syncopal episodes.  Stress echo and Holter monitor were benign.  Symptoms reportedly improved at that time after starting Prozac and were felt to be anxiety driven.  No further workup was recommended.  Patient is on once weekly GLP-1 agonist Ozempic, last dose 03/29/2022.  Patient will need day of surgery labs and evaluation.  EKG 03/24/2020: Sinus tachycardia.  Rate 106.  T wave flattening.  Stress echo 03/01/2017: Study Conclusions   - Stress: Functional capacity was normal.  - Stress ECG conclusions: There were no stress arrhythmias or    conduction abnormalities. The stress ECG was non-diagnostic.  - Staged echo: Normal echo stress   Impressions:   - Normal study after maximal exercise.   Event monitor 10/17/2016: Conclusion:  Holter monitoring was largely unremarkable. Patient was asymptomatic. Average heart rate was elevated. Findings of the test discussed with the patient at appointment today.    Wynonia Musty [facility] Short Stay Center/Anesthesiology Phone [phone] 04/10/2022 1:18 PM

## 2022-04-10 NOTE — Anesthesia Preprocedure Evaluation (Addendum)
Anesthesia Evaluation  Patient identified by MRN, date of birth, ID band Patient awake    Reviewed: Allergy & Precautions, NPO status , Patient's Chart, lab work & pertinent test results, reviewed documented beta blocker date and time   Airway Mallampati: II  TM Distance: >3 FB Neck ROM: Full    Dental  (+) Dental Advisory Given, Missing   Pulmonary sleep apnea    Pulmonary exam normal breath sounds clear to auscultation       Cardiovascular hypertension, Pt. on home beta blockers Normal cardiovascular exam Rhythm:Regular Rate:Normal     Neuro/Psych  Headaches, Seizures -,  PSYCHIATRIC DISORDERS Anxiety Depression Bipolar Disorder   TBI  Neuromuscular disease    GI/Hepatic Neg liver ROS,GERD  Medicated,,  Endo/Other  diabetes, Oral Hypoglycemic Agents  Obesity   Renal/GU negative Renal ROS     Musculoskeletal Right De Quervains tenosynovitis   Abdominal   Peds  Hematology negative hematology ROS (+)   Anesthesia Other Findings   Reproductive/Obstetrics                             Anesthesia Physical Anesthesia Plan  ASA: 3  Anesthesia Plan: MAC   Post-op Pain Management: Tylenol PO (pre-op)*   Induction: Intravenous  PONV Risk Score and Plan: 2 and Midazolam and TIVA  Airway Management Planned: Natural Airway and Simple Face Mask  Additional Equipment:   Intra-op Plan:   Post-operative Plan:   Informed Consent: I have reviewed the patients History and Physical, chart, labs and discussed the procedure including the risks, benefits and alternatives for the proposed anesthesia with the patient or authorized representative who has indicated his/her understanding and acceptance.     Dental advisory given  Plan Discussed with: CRNA  Anesthesia Plan Comments: (PAT note by Karoline Caldwell, PA-C: 37 year old female with pertinent history includes bipolar 1 disorder, PTSD, anxiety  disorder, psychogenic nonepileptic pseudo seizures, GERD, hiatal hernia, reportedly mild OSA not on CPAP.  Per prior neurology records, patient has history of psychogenic nonepileptic events diagnosed by EMU at [facility] in 2014.  Patient also had prior cardiology workup in 2018 for questionable syncopal episodes.  Stress echo and Holter monitor were benign.  Symptoms reportedly improved at that time after starting Prozac and were felt to be anxiety driven.  No further workup was recommended.  Patient is on once weekly GLP-1 agonist Ozempic, last dose 03/29/2022.  Patient will need day of surgery labs and evaluation.  EKG 03/24/2020: Sinus tachycardia.  Rate 106.  T wave flattening.  Stress echo 03/01/2017: Study Conclusions   - Stress: Functional capacity was normal.  - Stress ECG conclusions: There were no stress arrhythmias or  conduction abnormalities. The stress ECG was non-diagnostic.  - Staged echo: Normal echo stress   Impressions:   - Normal study after maximal exercise.   Event monitor 10/17/2016: Conclusion: Holter monitoring was largely unremarkable. Patient was asymptomatic. Average heart rate was elevated. Findings of the test discussed with the patient at appointment today.   )        Anesthesia Quick Evaluation

## 2022-04-10 NOTE — Progress Notes (Signed)
PCP - Louann Sjogren FNP Cardiologist - Denies  Chest x-ray - 06/23/21 EKG - DOS unless requested records are faxed over Stress Test - 2018 at [facility]  ECHO - 03/03/21  CPAP - mild does not wear cpap  OZEMPIC last dose 03/29/22  ERAS Protcol - Clears until 0430  Anesthesia review: Y  Patient verbally denies any shortness of breath, fever, cough and chest pain during phone call   -------------  SDW INSTRUCTIONS given:  Your procedure is scheduled on 04/11/22.  Report to [facility] Main Entrance "A" at 0530 A.M., and check in at the Admitting office.  Call this number if you have problems the morning of surgery:  [phone]   Remember:  Do not eat after midnight the night before your surgery  You may drink clear liquids until 0430 the morning of your surgery.   Clear liquids allowed are: Water, Non-Citrus Juices (without pulp), Carbonated Beverages, Clear Tea, Black Coffee Only, and Gatorade    Take these medicines the morning of surgery with A SIP OF WATER  HYDROcodone-acetaminophen (NORCO/VICODIN)  metoprolol succinate (TOPROL-XL)  omeprazole (PRILOSEC)  verapamil (CALAN)  albuterol (VENTOLIN HFA)-if needed (Please bring on the day of surgery) cyclobenzaprine (FLEXERIL)-if needed meclizine (ANTIVERT)-if needed hydrOXYzine (ATARAX)-if needed eletriptan (RELPAX)-if needed  As of today, STOP taking any Aspirin (unless otherwise instructed by your surgeon) Aleve, Naproxen, Ibuprofen, Motrin, Advil, Goody's, BC's, all herbal medications, fish oil, and all vitamins.                      Do not wear jewelry, make up, or nail polish            Do not wear lotions, powders, perfumes/colognes, or deodorant.            Do not shave 48 hours prior to surgery.  Men may shave face and neck.            Do not bring valuables to the hospital.            [facility] is not responsible for any belongings or valuables.  Do NOT Smoke (Tobacco/Vaping) 24 hours prior to your procedure If  you use a CPAP at night, you may bring all equipment for your overnight stay.   Contacts, glasses, dentures or bridgework may not be worn into surgery.      For patients admitted to the hospital, discharge time will be determined by your treatment team.   Patients discharged the day of surgery will not be allowed to drive home, and someone needs to stay with them for 24 hours.    Special instructions:   [place]- Preparing For Surgery  Before surgery, you can play an important role. Because skin is not sterile, your skin needs to be as free of germs as possible. You can reduce the number of germs on your skin by washing with CHG (chlorahexidine gluconate) Soap before surgery.  CHG is an antiseptic cleaner which kills germs and bonds with the skin to continue killing germs even after washing.    Oral Hygiene is also important to reduce your risk of infection.  Remember - BRUSH YOUR TEETH THE MORNING OF SURGERY WITH YOUR REGULAR TOOTHPASTE  Please do not use if you have an allergy to CHG or antibacterial soaps. If your skin becomes reddened/irritated stop using the CHG.  Do not shave (including legs and underarms) for at least 48 hours prior to first CHG shower. It is OK to shave your face.  Please follow these instructions carefully.   Shower the NIGHT BEFORE SURGERY and the MORNING OF SURGERY with DIAL Soap.   Pat yourself dry with a CLEAN TOWEL.  Wear CLEAN PAJAMAS to bed the night before surgery  Place CLEAN SHEETS on your bed the night of your first shower and DO NOT SLEEP WITH PETS.   Day of Surgery: Please shower morning of surgery  Wear Clean/Comfortable clothing the morning of surgery Do not apply any deodorants/lotions.   Remember to brush your teeth WITH YOUR REGULAR TOOTHPASTE.   Questions were answered. Patient verbalized understanding of instructions.

## 2022-04-11 ENCOUNTER — Ambulatory Visit (HOSPITAL_COMMUNITY)
Admission: RE | Admit: 2022-04-11 | Discharge: 2022-04-11 | Disposition: A | Discharge status: Home / Self Care | Payer: Medicaid Other | Attending: Orthopedic Surgery | Admitting: Orthopedic Surgery

## 2022-04-11 ENCOUNTER — Ambulatory Visit (HOSPITAL_COMMUNITY): Payer: Medicaid Other | Admitting: Registered Nurse

## 2022-04-11 ENCOUNTER — Encounter (HOSPITAL_COMMUNITY): Payer: Self-pay | Admitting: Orthopedic Surgery

## 2022-04-11 ENCOUNTER — Other Ambulatory Visit: Payer: Self-pay

## 2022-04-11 ENCOUNTER — Encounter (HOSPITAL_COMMUNITY)
Admission: RE | Disposition: A | Discharge status: Home / Self Care | Payer: Self-pay | Source: Home / Self Care | Attending: Orthopedic Surgery

## 2022-04-11 ENCOUNTER — Ambulatory Visit (HOSPITAL_BASED_OUTPATIENT_CLINIC_OR_DEPARTMENT_OTHER): Payer: Medicaid Other | Admitting: Registered Nurse

## 2022-04-11 DIAGNOSIS — E119 Type 2 diabetes mellitus without complications: Secondary | ICD-10-CM | POA: Diagnosis not present

## 2022-04-11 DIAGNOSIS — R569 Unspecified convulsions: Secondary | ICD-10-CM | POA: Insufficient documentation

## 2022-04-11 DIAGNOSIS — M654 Radial styloid tenosynovitis [de Quervain]: Secondary | ICD-10-CM

## 2022-04-11 DIAGNOSIS — I10 Essential (primary) hypertension: Secondary | ICD-10-CM | POA: Diagnosis not present

## 2022-04-11 DIAGNOSIS — G4730 Sleep apnea, unspecified: Secondary | ICD-10-CM | POA: Insufficient documentation

## 2022-04-11 DIAGNOSIS — E669 Obesity, unspecified: Secondary | ICD-10-CM | POA: Insufficient documentation

## 2022-04-11 DIAGNOSIS — F418 Other specified anxiety disorders: Secondary | ICD-10-CM

## 2022-04-11 DIAGNOSIS — Z6831 Body mass index (BMI) 31.0-31.9, adult: Secondary | ICD-10-CM | POA: Diagnosis not present

## 2022-04-11 DIAGNOSIS — K219 Gastro-esophageal reflux disease without esophagitis: Secondary | ICD-10-CM | POA: Insufficient documentation

## 2022-04-11 DIAGNOSIS — Z7984 Long term (current) use of oral hypoglycemic drugs: Secondary | ICD-10-CM | POA: Diagnosis not present

## 2022-04-11 HISTORY — DX: Polyneuropathy, unspecified: G62.9

## 2022-04-11 HISTORY — DX: Abnormal levels of other serum enzymes: R74.8

## 2022-04-11 HISTORY — DX: Gastro-esophageal reflux disease without esophagitis: K21.9

## 2022-04-11 HISTORY — PX: DORSAL COMPARTMENT RELEASE: SHX5039

## 2022-04-11 HISTORY — DX: COVID-19: U07.1

## 2022-04-11 HISTORY — DX: Depression, unspecified: F32.A

## 2022-04-11 HISTORY — DX: Tachycardia, unspecified: R00.0

## 2022-04-11 HISTORY — DX: Iron deficiency anemia, unspecified: D50.9

## 2022-04-11 HISTORY — DX: Post-traumatic stress disorder, unspecified: F43.10

## 2022-04-11 HISTORY — DX: Essential (primary) hypertension: I10

## 2022-04-11 HISTORY — DX: Sleep apnea, unspecified: G47.30

## 2022-04-11 HISTORY — DX: Lesion of sciatic nerve, unspecified lower limb: G57.00

## 2022-04-11 LAB — BASIC METABOLIC PANEL
Anion gap: 8 (ref 5–15)
BUN: 7 mg/dL (ref 6–20)
CO2: 26 mmol/L (ref 22–32)
Calcium: 8.5 mg/dL — ABNORMAL LOW (ref 8.9–10.3)
Chloride: 104 mmol/L (ref 98–111)
Creatinine, Ser: 0.68 mg/dL (ref 0.44–1.00)
GFR, Estimated: >60 mL/min (ref >60)
Glucose, Bld: 90 mg/dL (ref 70–99)
Potassium: 3.5 mmol/L (ref 3.5–5.1)
Sodium: 138 mmol/L (ref 135–145)

## 2022-04-11 LAB — CBC
HCT: 36.1 % (ref 36.0–46.0)
Hemoglobin: 11.8 g/dL — ABNORMAL LOW (ref 12.0–15.0)
MCH: 28.1 pg (ref 26.0–34.0)
MCHC: 32.7 g/dL (ref 30.0–36.0)
MCV: 86 fL (ref 80.0–100.0)
Platelets: 175 10*3/uL (ref 150–400)
RBC: 4.2 MIL/uL (ref 3.87–5.11)
RDW: 12.5 % (ref 11.5–15.5)
WBC: 4.1 10*3/uL (ref 4.0–10.5)
nRBC: 0 % (ref 0.0–0.2)

## 2022-04-11 SURGERY — RELEASE, FIRST DORSAL COMPARTMENT, HAND
Anesthesia: Monitor Anesthesia Care | Site: Hand | Laterality: Right

## 2022-04-11 MED ORDER — FENTANYL CITRATE (PF) 250 MCG/5ML IJ SOLN
INTRAMUSCULAR | Status: AC
  Filled 2022-04-11: qty 5

## 2022-04-11 MED ORDER — 0.9 % SODIUM CHLORIDE (POUR BTL) OPTIME
TOPICAL | Status: DC | PRN
  Administered 2022-04-11: 1000 mL

## 2022-04-11 MED ORDER — PROPOFOL 10 MG/ML IV BOLUS
INTRAVENOUS | Status: DC | PRN
  Administered 2022-04-11: 30 mg via INTRAVENOUS
  Administered 2022-04-11: 20 mg via INTRAVENOUS

## 2022-04-11 MED ORDER — LIDOCAINE HCL 1 % IJ SOLN
INTRAMUSCULAR | Status: DC | PRN
  Administered 2022-04-11: 5 mL

## 2022-04-11 MED ORDER — PHENYLEPHRINE HCL (PRESSORS) 10 MG/ML IV SOLN
INTRAVENOUS | Status: DC | PRN
  Administered 2022-04-11 (×2): 80 ug via INTRAVENOUS

## 2022-04-11 MED ORDER — PROPOFOL 500 MG/50ML IV EMUL
INTRAVENOUS | Status: DC | PRN
  Administered 2022-04-11: 100 ug/kg/min via INTRAVENOUS

## 2022-04-11 MED ORDER — DEXAMETHASONE SODIUM PHOSPHATE 10 MG/ML IJ SOLN
INTRAMUSCULAR | Status: AC
  Filled 2022-04-11: qty 1

## 2022-04-11 MED ORDER — HYDROCODONE-ACETAMINOPHEN 5-325 MG PO TABS: 1.0000 | ORAL_TABLET | Freq: Four times a day (QID) | ORAL | 0 refills | Status: AC | PRN

## 2022-04-11 MED ORDER — MIDAZOLAM HCL 2 MG/2ML IJ SOLN
INTRAMUSCULAR | Status: DC | PRN
  Administered 2022-04-11 (×2): 1 mg via INTRAVENOUS

## 2022-04-11 MED ORDER — LIDOCAINE HCL 1 % IJ SOLN
INTRAMUSCULAR | Status: AC
  Filled 2022-04-11: qty 20

## 2022-04-11 MED ORDER — BACITRACIN ZINC 500 UNIT/GM EX OINT
TOPICAL_OINTMENT | CUTANEOUS | Status: AC
  Filled 2022-04-11: qty 28.35

## 2022-04-11 MED ORDER — ONDANSETRON HCL 4 MG/2ML IJ SOLN
INTRAMUSCULAR | Status: AC
  Filled 2022-04-11: qty 2

## 2022-04-11 MED ORDER — ACETAMINOPHEN 500 MG PO TABS
1000.0000 mg | ORAL_TABLET | Freq: Once | ORAL | Status: AC
  Administered 2022-04-11: 1000 mg via ORAL
  Filled 2022-04-11: qty 2

## 2022-04-11 MED ORDER — BACITRACIN ZINC 500 UNIT/GM EX OINT
TOPICAL_OINTMENT | CUTANEOUS | Status: DC | PRN
  Administered 2022-04-11: 1 via TOPICAL

## 2022-04-11 MED ORDER — LIDOCAINE 2% (20 MG/ML) 5 ML SYRINGE
INTRAMUSCULAR | Status: AC
  Filled 2022-04-11: qty 5

## 2022-04-11 MED ORDER — PROPOFOL 1000 MG/100ML IV EMUL
INTRAVENOUS | Status: AC
  Filled 2022-04-11: qty 100

## 2022-04-11 MED ORDER — CEFAZOLIN SODIUM-DEXTROSE 2-4 GM/100ML-% IV SOLN
2.0000 g | INTRAVENOUS | Status: AC
  Administered 2022-04-11: 2 g via INTRAVENOUS
  Filled 2022-04-11: qty 100

## 2022-04-11 MED ORDER — ONDANSETRON HCL 4 MG/2ML IJ SOLN
INTRAMUSCULAR | Status: DC | PRN
  Administered 2022-04-11: 4 mg via INTRAVENOUS

## 2022-04-11 MED ORDER — BUPIVACAINE HCL (PF) 0.5 % IJ SOLN
INTRAMUSCULAR | Status: AC
  Filled 2022-04-11: qty 30

## 2022-04-11 MED ORDER — BUPIVACAINE HCL (PF) 0.5 % IJ SOLN
INTRAMUSCULAR | Status: DC | PRN
  Administered 2022-04-11: 5 mL

## 2022-04-11 MED ORDER — MIDAZOLAM HCL 2 MG/2ML IJ SOLN
INTRAMUSCULAR | Status: AC
  Filled 2022-04-11: qty 2

## 2022-04-11 MED ORDER — FENTANYL CITRATE (PF) 250 MCG/5ML IJ SOLN
INTRAMUSCULAR | Status: DC | PRN
  Administered 2022-04-11: 50 ug via INTRAVENOUS

## 2022-04-11 MED ORDER — LACTATED RINGERS IV SOLN: INTRAVENOUS | Status: DC

## 2022-04-11 SURGICAL SUPPLY — 30 items
BLADE SURG 15 STRL LF DISP TIS (BLADE) ×1 IMPLANT
BLADE SURG 15 STRL SS (BLADE) ×1
BNDG CMPR 9X4 STRL LF SNTH (GAUZE/BANDAGES/DRESSINGS) ×1
BNDG ELASTIC 4X5.8 VLCR STR LF (GAUZE/BANDAGES/DRESSINGS) ×1 IMPLANT
BNDG ESMARK 4X9 LF (GAUZE/BANDAGES/DRESSINGS) ×1 IMPLANT
CORD BIPOLAR FORCEPS 12FT (ELECTRODE) IMPLANT
COVER BACK TABLE 60X90IN (DRAPES) ×1 IMPLANT
CUFF TOURN SGL QUICK 18X4 (TOURNIQUET CUFF) ×1 IMPLANT
DRAPE EXTREMITY T 121X128X90 (DISPOSABLE) ×1 IMPLANT
DRAPE SURG 17X23 STRL (DRAPES) ×1 IMPLANT
DRSG EMULSION OIL 3X3 NADH (GAUZE/BANDAGES/DRESSINGS) ×1 IMPLANT
GAUZE SPONGE 4X4 12PLY STRL (GAUZE/BANDAGES/DRESSINGS) ×1 IMPLANT
GLOVE BIOGEL PI IND STRL 7.5 (GLOVE) ×1 IMPLANT
GOWN STRL REUS W/ TWL LRG LVL3 (GOWN DISPOSABLE) ×1 IMPLANT
GOWN STRL REUS W/TWL LRG LVL3 (GOWN DISPOSABLE) ×1
GOWN STRL REUS W/TWL XL LVL3 (GOWN DISPOSABLE) ×1 IMPLANT
KNIFE CARPAL TUNNEL (BLADE) ×1 IMPLANT
NDL HYPO 22X1.5 SAFETY MO (MISCELLANEOUS) ×1 IMPLANT
NEEDLE HYPO 22X1.5 SAFETY MO (MISCELLANEOUS) ×1 IMPLANT
NEEDLE SAFETY HYPO 22GAX1.5 (MISCELLANEOUS) ×1
NS IRRIG 1000ML POUR BTL (IV SOLUTION) ×1 IMPLANT
PACK BASIN DAY SURGERY FS (CUSTOM PROCEDURE TRAY) ×1 IMPLANT
PADDING CAST ABS COTTON 4X4 ST (CAST SUPPLIES) ×1 IMPLANT
SHEET MEDIUM DRAPE 40X70 STRL (DRAPES) ×1 IMPLANT
SUT ETHILON 4 0 PS 2 18 (SUTURE) ×1 IMPLANT
SYR 10ML LL (SYRINGE) ×1 IMPLANT
SYR BULB EAR ULCER 3OZ GRN STR (SYRINGE) ×1 IMPLANT
TOWEL GREEN STERILE FF (TOWEL DISPOSABLE) ×2 IMPLANT
TRAY DSU PREP LF (CUSTOM PROCEDURE TRAY) ×1 IMPLANT
UNDERPAD 30X36 HEAVY ABSORB (UNDERPADS AND DIAPERS) ×1 IMPLANT

## 2022-04-11 NOTE — Transfer of Care (Signed)
Immediate Anesthesia Transfer of Care Note  Patient: Sheila Cooper  Procedure(s) Performed: Right Bluford Kaufmann release (Right: Hand)  Patient Location: PACU  Anesthesia Type:MAC  Level of Consciousness: awake, alert , and oriented  Airway & Oxygen Therapy: Patient Spontanous Breathing  Post-op Assessment: Report given to RN and Post -op Vital signs reviewed and stable  Post vital signs: Reviewed and stable  Last Vitals:  Vitals Value Taken Time  BP    Temp    Pulse 84 04/11/22 0815  Resp    SpO2 96% 04/11/22 0815  Vitals shown include unvalidated device data.  Last Pain:  Vitals:   04/11/22 0627  TempSrc: Oral  PainSc:          Complications: There were no known notable events for this encounter.

## 2022-04-11 NOTE — Discharge Instructions (Signed)
  Orthopaedic Hand Surgery Discharge Instructions  WEIGHT BEARING STATUS: Non weight bearing on operative extremity  INCISION CARE: Keep dressing over your incision clean and dry until 5 days after surgery. You may shower by placing a waterproof covering over your dressing. Once dressing is removed, you may allow water to run over the incision and then place Band-Aids over incision. Do not scrub your incision or apply creams/lotions. Do not submerge your incision or swim for 3 weeks after surgery. Contact your surgeon or primary care doctor if you develop redness or drainage from your incision.   PAIN CONTROL: First line medications for post operative pain control are Tylenol (acetaminophen) and Motrin (ibuprofen) if you are able to take these medications. If you have been prescribed a medication these can be taken as breakthrough pain medications. Please note that some narcotic pain medication has acetaminophen added and you should never consume more than 4,090m of acetaminophen in 24-hour period. Please note that if you are given Toradol (ketorolac) you should not take similar medications such as ibuprofen or naproxen.  DISCHARGE MEDICATIONS: If you have been prescribed medication it was sent electronically to your pharmacy. No changes have been made to your home medications.  ICE/ELEVATION: Ice and elevate your injured extremity as needed. Avoid direct contact of ice with skin.   BANDAGE FEELS TOO TIGHT: If your bandage feels too tight, first make sure you are elevating your fingers as much as possible. The outer layer of the bandage can be unwrapped and reapplied more loosely. If no improvement, you may carefully cut the inner layer longitudinally until the pressure has resolved and then rewrap the outer layer. If you are not comfortable with these instructions, please call the office and the bandage can be changed for you.   FOLLOW UP: You will be called after surgery with an appointment date and  time, however if you have not received a phone call within 3 days, please call during regular office hours at [phone] schedule a post operative appointment.  Please Seek Medical Attention if: Call MD for: pain or pressure in chest, jaw, arm, back, neck  Call MD for: temperature greater than 101 F for more than 24 hrs Call MD for: difficulty breathing Call MD for: incision redness, bleeding, drainage  Call MD for: palpitations or feeling that the heart is racing  Call MD for: increased swelling in arm, leg, ankle, or abdomen  Call MD for: lightheadedness, dizziness, fainting Call 911 or go to ER for any medical emergency if you are not able to get in touch with your doctor   J. RSable Feil MD Orthopaedic Hand Surgeon [facility] Office number: [phone], [place] [address]

## 2022-04-11 NOTE — Anesthesia Postprocedure Evaluation (Signed)
Anesthesia Post Note  Patient: Sheila Cooper  Procedure(s) Performed: Right Bluford Kaufmann release (Right: Hand)     Patient location during evaluation: PACU Anesthesia Type: MAC Level of consciousness: awake and alert Pain management: pain level controlled Vital Signs Assessment: post-procedure vital signs reviewed and stable Respiratory status: spontaneous breathing, nonlabored ventilation, respiratory function stable and patient connected to nasal cannula oxygen Cardiovascular status: stable and blood pressure returned to baseline Postop Assessment: no apparent nausea or vomiting Anesthetic complications: no   There were no known notable events for this encounter.  Last Vitals:  Vitals:   04/11/22 0830 04/11/22 0843  BP: 95/69 105/77  Pulse: 79   Resp: 11 12  Temp:  (!) 36.4 C  SpO2: 96% 97%    Last Pain:  Vitals:   04/11/22 0815  TempSrc:   PainSc: 0-No pain                 Santa Lighter

## 2022-04-11 NOTE — Op Note (Signed)
OPERATIVE NOTE  DATE OF PROCEDURE: 04/11/2022  SURGEONS:  Primary: Orene Desanctis, MD  PREOPERATIVE DIAGNOSIS: Right De Quervains tenosynovitis  POSTOPERATIVE DIAGNOSIS: Same  NAME OF PROCEDURE:   Right DeQuervains First Dorsal Compartment Release  ANESTHESIA: MAC + Local  SKIN PREPARATION: Hibiclens  ESTIMATED BLOOD LOSS: Minimal  IMPLANTS: None  INDICATIONS:  Sheila Cooper is a 37 y.o. female who has the above preoperative diagnosis. The patient has decided to proceed with surgical intervention.  Risks, benefits and alternatives of operative management were discussed including, but not limited to, risks of anesthesia complications, infection, pain, persistent symptoms, stiffness, need for future surgery.  The patient understands, agrees and elects to proceed with surgery.    DESCRIPTION OF PROCEDURE: The patient was met in the pre-operative area and their identity was verified.  The operative location and laterality was also verified and marked.  The patient was brought to the OR and was placed supine on the table.  After repeat patient identification with the operative team anesthesia was provided and the patient was prepped and draped in the usual sterile fashion.  A final timeout was performed verifying the correction patient, procedure, location and laterality.  The right upper extremity was elevated and exsanguinated with an Esmarch. The tourniquet was inflated to 2102mHg. An oblique incision incision was made over the first dorsal compartment of the wrist. Skin and subcutaneous tissue was divided and care was taken to protect the radial sensory nerve. The first dorsal compartment tendon sheath was identified and clear of all superficial tissue and nerve branches and veins were protected. The sheath was divided and the most dorsal aspect in line with the APL and EPB tendons. Tenosynovium was removed. The APL tendon with multiple slips was freed from proximal to distal. The EPB tendon had a  separate subsheath that was also incised and completely released. The wound was irrigated with normal saline and the skin was closed with 4-0 nylon sutures. A sterile soft bandage was applied and tourniquet deflated. The fingers were pink and warm and well perfused at the end of the procedure. All counts were correct x 2. The patient was awoken from anesthesia, tolerated the procedure well and was brought to PACU for recovery in stable condition.    Sheila Holmes MD

## 2022-04-11 NOTE — Interval H&P Note (Signed)
History and Physical Interval Note:  04/11/2022 7:28 AM  Sheila Cooper  has presented today for surgery, with the diagnosis of Right De Quervains tenosynovitis.  The various methods of treatment have been discussed with the patient and family. After consideration of risks, benefits and other options for treatment, the patient has consented to  Procedure(s) with comments: Right Bluford Kaufmann release (Right) - 69mn as a surgical intervention.  The patient's history has been reviewed, patient examined, no change in status, stable for surgery.  I have reviewed the patient's chart and labs.  Questions were answered to the patient's satisfaction.     JOrene Desanctis

## 2022-04-11 NOTE — Progress Notes (Signed)
Orthopedic Tech Progress Note Patient Details:  ELIJAH HIRTZ 03/14/1985 [id number]  Ortho Devices Type of Ortho Device: Sling immobilizer Ortho Device/Splint Interventions: Ordered     Received call from PACU RN for a sling, sling dropped off at bedside. Vernona Rieger 04/11/2022, 8:44 AM

## 2022-04-12 ENCOUNTER — Encounter (HOSPITAL_COMMUNITY): Payer: Self-pay | Admitting: Orthopedic Surgery
